# Patient Record
Sex: Female | Born: 1945 | Race: White | Hispanic: No | Marital: Married | Smoking: Never smoker
Health system: Southern US, Community
[De-identification: ages and names within clinical notes are randomized; demographics above are authoritative.]

## PROBLEM LIST (undated history)

## (undated) DIAGNOSIS — F329 Major depressive disorder, single episode, unspecified: Secondary | ICD-10-CM

## (undated) DIAGNOSIS — G20 Parkinson's disease: Secondary | ICD-10-CM

## (undated) DIAGNOSIS — R51 Headache: Secondary | ICD-10-CM

## (undated) DIAGNOSIS — D649 Anemia, unspecified: Secondary | ICD-10-CM

## (undated) DIAGNOSIS — R32 Unspecified urinary incontinence: Secondary | ICD-10-CM

## (undated) DIAGNOSIS — R251 Tremor, unspecified: Secondary | ICD-10-CM

## (undated) DIAGNOSIS — R519 Headache, unspecified: Secondary | ICD-10-CM

## (undated) DIAGNOSIS — E7800 Pure hypercholesterolemia, unspecified: Secondary | ICD-10-CM

## (undated) DIAGNOSIS — F32A Depression, unspecified: Secondary | ICD-10-CM

## (undated) HISTORY — DX: Headache: R51

## (undated) HISTORY — DX: Headache, unspecified: R51.9

## (undated) HISTORY — DX: Unspecified urinary incontinence: R32

---
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
## 2009-08-23 ENCOUNTER — Encounter: Admission: RE | Admit: 2009-08-23 | Discharge: 2009-09-12 | Payer: Self-pay | Admitting: Sports Medicine

## 2015-08-07 LAB — HM MAMMOGRAPHY

## 2016-05-15 NOTE — Progress Notes (Signed)
Nicole Mills was seen today in the movement disorders clinic for neurologic consultation at the request of [place], Cleta Alberts, NP.  The consultation is for the evaluation of Parkinson's disease.  This patient is accompanied in the office by her daughters who supplement the history.  I have reviewed an extensive number of records from the patient's prior neurologist, Dr. Mervyn Skeeters.  The patient was diagnosed with Parkinson's disease at the age of 71 years old.  Her symptoms started on the right side of the body with slowness and stiffness.  Records indicate that she did well with her Parkinson's disease with the treatment of levodopa until fairly recently.  Freezing of gait started in early 2017.  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  She began to have off periods, particularly in the middle of the night and several times was tried on the extended release version of levodopa at bedtime, but reported that it caused headache and it was ultimately discontinued.  In December 30, 2015 entacapone was added to her levodopa regimen.  The patients daughter called to Dr. Sharion Dove office on December 8 stating that this medication caused diarrhea.  Dr. Mervyn Skeeters was not completely convinced that this was from the entacapone given that it took several weeks, and she asked her to discontinue it temporarily.  She went back on the medication and didn't have the diarrheaThe patient's biggest issues have been anxiety, which have been unrelated to off episodes.  Initially, Dr. Mervyn Skeeters tried to treat these episodes, but ultimately she was referred to psychiatrist and had her first psychiatry visit on 02/03/2016.  The patient reports that she was placed on Vistaril and Lexapro and her clonazepam was increased so that she could take it up to 5 times a day.  She ultimately ended up having hallucinations and significant worsening of her anxiety and so Vistaril was discontinued.  They called to Dr.  Sharion Dove office on 03/19/2016 and quetiapine was started.  Family states that they didn't start it because of black box warning.  They thought that it caused dementia. Memory change continued and Exelon was prescribed on 05/08/2016.  While they have the prescription, the family admits that she has not started it.  Daughter thinks that memory is not an issue but that medications were a contributing factor.  Pt is currently taking carbidopa/levodopa 25/100, 1.5 tablets with entacapone 5 times per day starting at 5:45am, 8:15am, 10:55am, 1:45pm, 4:40pm.  At 7:30 she will take the 25/100 CR and again at 12:15am.  She takes mirtazapine 15 mg and klonopin 0.5 mg, 1/2 at bedtime.  She takes klonopin, 1/2 tablet tid for panic attacks, in addition to the bedtime dose.  She went to another psychiatrist and was changed to ativan, but her daughter usually does not give her that.    Specific Symptoms:  Tremor: Yes.  , but controlled with med Family hx of similar:  Yes.  , maternal uncle Voice: "not bad" - never been to [facility] Sleep: sleeping well  Vivid Dreams:  No.  Acting out dreams:  No. Wet Pillows: No. Postural symptoms:  Yes.    Falls?  No. Bradykinesia symptoms: shuffling gait, slow movements and difficulty getting out of a chair; doesn't use cane or walker Loss of smell:  No. Loss of taste:  No. Urinary Incontinence:  No. Difficulty Swallowing:  No. Handwriting, micrographia: Yes.   Trouble with ADL's:  Yes.   (husband helps with shoes only)  Trouble  buttoning clothing: No. (but uses zippers) Depression:  Yes.   Memory changes:  Yes.   (Lives with husband and used to have adult son living with them who had schizophrenic son living with them but he went off his meds and had to move out and pt bought him a Games developer but pts anxiety didn't get better after he moved out) Hallucinations:  Yes.   (mostly during the night but some during the day  visual distortions: No. N/V:  No. Lightheaded:  Yes.    some in the AM  Syncope: No. Diplopia:  No. Dyskinesia:  Yes.     PREVIOUS MEDICATIONS:  levodopa, carbidopa/levodopa 50/200 CR (patient states that this causes headache), seroquel (given but not taken by Dr. Mervyn Skeeters); exelon (given by Dr. Mervyn Skeeters but not taken)  ALLERGIES:   Allergies  Allergen Reactions  . Penicillins Hives    CURRENT MEDICATIONS:  Outpatient Encounter Prescriptions as of 05/19/2016  Medication Sig  . atorvastatin (LIPITOR) 10 MG tablet Take 1 tablet by mouth daily.  . Carbidopa-Levodopa ER (SINEMET CR) 25-100 MG tablet controlled release Take 1 tablet by mouth 2 (two) times daily.  . clonazePAM (KLONOPIN) 1 MG tablet Take 0.5-1 mg by mouth daily.  . Cyanocobalamin (B-12) 5000 MCG CAPS Take 1 capsule by mouth daily.  . entacapone (COMTAN) 200 MG tablet Take 200 mg by mouth 5 (five) times daily.  Marland Kitchen escitalopram (LEXAPRO) 10 MG tablet Take 10 mg by mouth daily.  . mirtazapine (REMERON) 15 MG tablet Take 15 mg by mouth at bedtime.  . Multiple Vitamin (MULTIVITAMIN) capsule Take 1 capsule by mouth daily.  . Probiotic Product (PROBIOTIC PO) Take by mouth.  . vitamin C (ASCORBIC ACID) 500 MG tablet Take 500 mg by mouth daily.   No facility-administered encounter medications on file as of 05/19/2016.     PAST MEDICAL HISTORY:  No past medical history on file.  PAST SURGICAL HISTORY:  No past surgical history on file.  SOCIAL HISTORY:   Social History   Social History  . Marital status: Unknown    Spouse name: N/A  . Number of children: N/A  . Years of education: N/A   Occupational History  . Not on file.   Social History Main Topics  . Smoking status: Never Smoker  . Smokeless tobacco: Never Used  . Alcohol use Not on file  . Drug use: Unknown  . Sexual activity: Not on file   Other Topics Concern  . Not on file   Social History Narrative  . No narrative on file    FAMILY HISTORY:   No family status information on file.    ROS:  A complete 10  system review of systems was obtained and was unremarkable apart from what is mentioned above.  PHYSICAL EXAMINATION:    VITALS:   Vitals:   05/19/16 0941  BP: (!) 96/54  Pulse: (!) 59  SpO2: 95%  Weight: 136 lb 12.8 oz (62.1 kg)  Height: 5' 3" (1.6 m)    GEN:  The patient appears stated age and is in NAD. HEENT:  Normocephalic, atraumatic.  The mucous membranes are moist. The superficial temporal arteries are without ropiness or tenderness. CV:  RRR Lungs:  CTAB Neck/HEME:  There are no carotid bruits bilaterally.  Neurological examination:  Orientation:  Montreal Cognitive Assessment  05/19/2016 05/19/2016  Visuospatial/ Executive (0/5) 1 1  Naming (0/3) 2 2  Attention: Read list of digits (0/2) 1 0  Attention: Read list of letters (0/1)  0 0  Attention: Serial 7 subtraction starting at 100 (0/3) 1 1  Language: Repeat phrase (0/2) 0 0  Language : Fluency (0/1) 0 0  Abstraction (0/2) 0 0  Delayed Recall (0/5) 2 -  Orientation (0/6) 3 -  Total 10 -  Adjusted Score (based on education) 10 -   Cranial nerves: There is good facial symmetry.   There is mild facial hypomimia.  Pupils are equal round and reactive to light bilaterally. Fundoscopic exam reveals clear margins bilaterally. Extraocular muscles are intact. The visual fields are full to confrontational testing. The speech is fluent and clear.   She is hypophonic.  Soft palate rises symmetrically and there is no tongue deviation. Hearing is intact to conversational tone. Sensation: Sensation is intact to light and pinprick throughout (facial, trunk, extremities). Vibration is intact at the bilateral big toe. There is no extinction with double simultaneous stimulation. There is no sensory dermatomal level identified. Motor: Strength is 5/5 in the bilateral upper and lower extremities.   Shoulder shrug is equal and symmetric.  There is no pronator drift. Deep tendon reflexes: Deep tendon reflexes are 2+-3/4 at the bilateral  biceps, triceps, brachioradialis, patella and achilles. Plantar responses are upgoing on the right and neutral on the left.  Movement examination: Tone: There is normal tone in the bilateral upper extremities.  The tone in the lower extremities is normal..  Abnormal movements: There is mild to moderate dyskinesia, right more than L.  She has some dystonic posturing of the hands.  Coordination:  There is no decremation with RAM's, with any form of RAMS, including alternating supination and pronation of the forearm, hand opening and closing, finger taps, heel taps and toe taps. Gait and Station: The patient has difficulty arising out of a deep-seated chair without the use of the hands.  She makes several attempts, but is unable to do this without pushing off the chair.  Her stride length was actually quite good and she walks quickly and purposefully down the Certain.     ASSESSMENT/PLAN:  1.  Idiopathic Parkinson's disease.  The patient was diagnosed in approximately 2011.  Her disease has been complicated by freezing of gait, hallucinations and memory change.  -We discussed the diagnosis as well as pathophysiology of the disease.  We discussed treatment options as well as prognostic indicators.  Patient education was provided.  -We discussed that it used to be thought that levodopa would increase risk of melanoma but now it is believed that Parkinsons itself likely increases risk of melanoma. she is to get regular skin checks.  -She looked very adequately treated today and it was almost time for her to re-dose.  I am not sure she even needed to re-dose.  Her daughter was actually going to try to spread out dosing more.  Pt is currently taking carbidopa/levodopa 25/100, 1.5 tablets with entacapone 5 times per day starting at 5:45am, 8:15am, 10:55am, 1:45pm, 4:40pm.  At 7:30 p.m. she will take the 25/100 CR and again at 12:15am.  Dr. Mervyn Skeeters tried the carbidopa/levodopa 50/200 CR several times and the patient  reported it caused headache.  I did not change her medications today.  -Talked about duopa.  We talked about several things in testing including the levodopa inhaler and the pump/patch system.  -I will refer the patient to the Parkinson's program at the [facility], for PT/OT and ST.  We talked about the importance of safe, cardiovascular exercise in Parkinson's disease.  -We discussed community resources in  the area including patient support groups and community exercise programs for PD and pt education was provided to the patient.  She met with our social worker today to discuss these resources as well.  -congratulated her on [facility] and all of the exercise that she is doing.  She goes there 3 times a week.  2.  Memory Loss/parkinson dementia  -recommend neuropsych testing  -she is still driving, but I do not think that she likely should be driving, just based on [facility] testing.  We will see what their recommendations are from the neuropsychologist.    -Her daughter is monitoring her medications and has set up an alarmed pillbox.  3.  Anxiety  -This has been a very significant issue according to records and I reiterated to the patient and her family that she needs to continue to seek care with a psychiatrist.  Dr. Mervyn Skeeters has are the demonstrated that her anxiety is not an off issue.  I really would like to see her off of benzodiazepines during the day, primarily because of cognitive changes and risk of falls.  She was given a list of psychiatrists, at her family's request.  4.  -Greater than 50% of the 80 minute visit was spent in counseling answering questions and talking about what to expect now as well as in the future.    We talked about safety in the home.  This did not include the 45 minutes of non-face-to-face time that I spent in record review prior to the patient's visit.  I will plan on seeing her back in the next 3-5 months, sooner should new neurologic issues arise.  Cc:   [place], [name], NP

## 2016-05-19 ENCOUNTER — Encounter: Payer: Self-pay | Admitting: Neurology

## 2016-05-19 ENCOUNTER — Ambulatory Visit (INDEPENDENT_AMBULATORY_CARE_PROVIDER_SITE_OTHER): Payer: Medicare Other | Admitting: Neurology

## 2016-05-19 VITALS — BP 96/54 | HR 59 | Ht 63.0 in | Wt 136.8 lb

## 2016-05-19 DIAGNOSIS — G20 Parkinson's disease: Secondary | ICD-10-CM

## 2016-05-19 DIAGNOSIS — F0280 Dementia in other diseases classified elsewhere without behavioral disturbance: Secondary | ICD-10-CM

## 2016-05-19 DIAGNOSIS — F411 Generalized anxiety disorder: Secondary | ICD-10-CM | POA: Diagnosis not present

## 2016-05-19 DIAGNOSIS — G20A1 Parkinson's disease without dyskinesia, without mention of fluctuations: Secondary | ICD-10-CM

## 2016-05-19 NOTE — Progress Notes (Signed)
Psychosocial/Social Work Assessment Movement Disorders Clinic, [facility]  Patient: Nicole Mills MRN: [id number] Date of Assessment: 05/19/2016  PHYSICAL/MEDICAL SITUATION  Current Movement Disorder Diagnosis?: Parkinson's disease   DEMOGRAPHIC INFORMATION  Race/Ethnicity: Caucasian  Age: 71 y.o.  Relationship Status    Married      Living Situation     With spouse/partner        COGNITION  AND MENTAL  STATUS Alert & Oriented x 4   Comments: Reports of memory change     AMBULATION   No balance issues     AMBULATION ASSISTANCE   Answer the following as Yes (Y) or No (N)   Walks independently: Y  Uses a cane or trekking poles: N  Uses a walker: N  Uses a wheelchair: N    Activities of Daily Living (ADLs)  Independent in activities of daily living?  Yes  No  Some   How many stories is the home?  1 story home On what story are primary living areas?  N/A  Feel safe in the home?     Driving  Currently driving? Yes     Transportation:  Does patient have reliable transportation to medical appointments?  Yes  No  Does patient need transportation resources?  Yes  No  Comments:       EXERCISE AND ACTIVITY ENGAGEMENT  Information about Current Exercise    Types of exercise: [facility]   Frequency: 3 x a week      Past Rehabilitation or Therapy     Physical Therapy No Does pt need referral for? Referral made  Occupational therapy: No Does pt need referral for?Referral made  Speech therapy: No Does pt need referral for? Referral made     MOOD, COPING, AND SUPPORT        Mood and Affect      Anxious           Current or History of Medications for Mood (Yes (Y) or No (N)? Yes  Use of Mental Health Services: No- resources provided     Attends support group?  (Yes (Y) or No (N)) No   Interested in information on local support groups? Yes- information provided  Family live nearby? Yes  Use in-home care? No  Support System    Good  Poor  Other/Comments:  Supportive daughters         Clinical Social Work Assessment and Plan   CSW received request from Dr. Carles Collet to meet with pt and pt daughters during today's office visit. CSW met with pt and pt daughters in exam room. CSW introduced self and informedof social work services including connection to [employer], short-term coping/problem-solving counseling, and involvement in [employer] support group.   CSW provided supportive listening as pt shared that today is her first visit with Dr. Carles Collet, but pt was diagnosed with Parkinson's disease eight years ago. Pt lives with her husband in a one story home in [place]. Pt is independent with ADL's and ambulates independently.  Pt two daughter present during today's visit are supportive and actively involved in pt care.   Pt reports that she has been involved at [facility] for seven years. Pt reports that she goes to personal training three times a week. CSW clarified pt daughters questions about  center neurorehabilitation PT, OT, and ST as pt daughters report that Dr. Carles Collet is making a referral to neurorehab. Pt feels PT and OT will be beneficial, but reports that she  is unsure that she will need ST. CSW provided information on other community PD exercise programs and provided positive reinforcement for pt exercise with [facility] and her commitment to exercise.   Pt daughter reports that biggest challenge at this time is pt anxiety. CSW discussed mental health resources and provided contact information for mental health providers including psychiatrists and counselors. CSW discussed benefits of Cognitive Behavioral Therapy (CBT). CSW provided education surrounding finding the right therapist and encouraged pt to continue to seek therapist to find the right fit for her as the client-therapist relationship is important. CSW also discussed benefits of attending support group and pt states that she is willing to go to support group.  CSW provided information about [employer] Support Group and encouraged pt and pt family to attend.   CSW answered questions to the best of my knowledge throughout visit and provided community resources and mental health resources. CSW provided CSW contact information and encouraged pt and pt family to contact CSW if social work needs arise.    Product/process development scientist Provided: List of PD community resources/exercise programs, Mental Health Providers List, Gaffer for [employer].  Social Work Treatment Plan:  1. CSW provided psychosocial support and counseling surrounding pt diagnosis and current challenges with anxiety. 2. CSW provided mental health resources and pt is recommended to seek psychiatric care for anxiety. 3. CSW to remain available to provide psychosocial support as necessary. 4. [name] and her family have been provided CSW contact information and agree to contact me if I can be of further assistance with any other social work services.     Alison Murray, MSW, LCSW Clinical Social Worker Movement [facility] [phone]

## 2016-05-19 NOTE — Patient Instructions (Signed)
1. You have been referred to Neuro Rehab. They will call you directly to schedule an appointment.  Please call [phone] if you do not hear from them.   2. Let us know about Neurocognitive evaluation and we will set that up.

## 2016-06-25 ENCOUNTER — Telehealth: Payer: Self-pay | Admitting: Neurology

## 2016-06-25 NOTE — Telephone Encounter (Signed)
Left message on machine for patient to call back.  Daughter not on DPR - Husband's name on DPR but it was not signed. I can only talk to the patient. Awaiting call back.

## 2016-06-25 NOTE — Telephone Encounter (Signed)
Caller: PT's daughter Claris Gower  Urgent? Yes  Reason for the call: She said her mother is declining rapidly and wanted to know if she should bring her in to see Dr Tat

## 2016-10-19 NOTE — Progress Notes (Signed)
Nicole Mills was seen today in the movement disorders clinic for neurologic consultation at the request of Nicole Mills, Nicole M, NP.  The consultation is for the evaluation of Parkinson's disease.  This patient is accompanied in the office by her daughters who supplement the history.  I have reviewed an extensive number of records from the patient's prior neurologist, Dr. Seward Mills.  The patient was diagnosed with Parkinson's disease at the age of 71 years old.  Her symptoms started on the right side of the body with slowness and stiffness.  Records indicate that she did well with her Parkinson's disease with the treatment of levodopa until fairly recently.  Freezing of gait started in early 2017.  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  She began to have off periods, particularly in the middle of the night and several times was tried on the extended release version of levodopa at bedtime, but reported that it caused headache and it was ultimately discontinued.  In December 30, 2015 entacapone was added to her levodopa regimen.  The patients daughter called to Dr. [name] office on December 8 stating that this medication caused diarrhea.  Dr. Seward Mills was not completely convinced that this was from the entacapone given that it took several weeks, and she asked her to discontinue it temporarily.  She went back on the medication and didn't have the diarrheaThe patient's biggest issues have been anxiety, which have been unrelated to off episodes.  Initially, Dr. Seward Mills tried to treat these episodes, but ultimately she was referred to psychiatrist and had her first psychiatry visit on 02/03/2016.  The patient reports that she was placed on Vistaril and Lexapro and her clonazepam was increased so that she could take it up to 5 times a day.  She ultimately ended up having hallucinations and significant worsening of her anxiety and so Vistaril was discontinued.  They called to Dr.  [name] office on 03/19/2016 and quetiapine was started.  Family states that they didn't start it because of black box warning.  They thought that it caused dementia. Memory change continued and Exelon was prescribed on 05/08/2016.  While they have the prescription, the family admits that she has not started it.  Daughter thinks that memory is not an issue but that medications were a contributing factor.  Pt is currently taking carbidopa/levodopa 25/100, 1.5 tablets with entacapone 5 times per day starting at 5:45am, 8:15am, 10:55am, 1:45pm, 4:40pm.  At 7:30 she will take the 25/100 CR and again at 12:15am.  She takes mirtazapine 15 mg and klonopin 0.5 mg, 1/2 at bedtime.  She takes klonopin, 1/2 tablet tid for panic attacks, in addition to the bedtime dose.  She went to another psychiatrist and was changed to ativan, but her daughter usually does not give her that.    10/20/16 update:  Patient is seen today in follow-up for her Parkinson's disease.  She is accompanied by her daughters who supplement the history.  I have reviewed records since our last visit.  She has since been back to Dr. Seward Mills.  She last saw her on 09/17/2016.  She made no changes in her Parkinson's medicine.  Pt is currently taking carbidopa/levodopa 25/100, 1.5 tablets with entacapone 6 times per day starting at 5:45am and continuing every 2 hours (was 5 times per day).  At 7:30 p.Mills. she will take the 25/100 CR and again at 12:15am.  The biggest issue that she was having and was discussing with  Dr. Seward Mills was anxiety.  This has been a long-standing issue.  Dr. Seward Mills had her continue her mirtazapine, 30 mg and clonazepam 0.5 mg, 1-1/2 tablets daily, but increase her Zoloft from 75 mg daily to 125 mg daily.  She was told to discontinue her Lexapro. Her family states that they went to her PA and she was told to d/c the zololft and increase the lexapro, 20 mg.  Her panic attacks are better.   Her family called Dr. [name] office and  asked for a prescription for Exelon and she was started on that at the end of July. Her daughter states that they haven't gone on that yet as her daughter "didn't know what to do."   I did refer her to the neuro rehabilitation center last visit for therapy.  They called her, but never received a call back to schedule that appointment.  I also recommended neurocognitive testing last visit, but they did not follow through with the neuropsychologist.  Daughter states that has lost weight over the year and patient thinks that due to panic attacks.  Had episodes of paranoia since last visit, running to neighbor and calling police.  Pt c/o head pain.  It is in a crown like pattern around the head but it is sharp and shooting.  She thinks it happens when she takes the klonopin but her daughter states that it has been going on for years and that is why she was originally prescribed the klonopin and that is why she wears a visor.  Her daughter states that she actually was hiding klonopin from her (daughter does her meds).    PREVIOUS MEDICATIONS:  levodopa, carbidopa/levodopa 50/200 CR (patient states that this causes headache), seroquel (given but not taken by Dr. Seward Mills); exelon (given by Dr. Seward Mills but not taken)  ALLERGIES:   Allergies  Allergen Reactions  . Penicillins Hives    CURRENT MEDICATIONS:  Outpatient Encounter Prescriptions as of 10/20/2016  Medication Sig  . carbidopa-levodopa (SINEMET IR) 25-100 MG tablet Take 1.5 tablets by mouth 6 (six) times daily.   . Carbidopa-Levodopa ER (SINEMET CR) 25-100 MG tablet controlled release Take 2 tablets by mouth at bedtime.   . clonazePAM (KLONOPIN) 1 MG tablet Take 0.5-1 mg by mouth daily. 0.5 mg 3-4 x daily and 0.5 at bedtime  . Cyanocobalamin (B-12) 5000 MCG CAPS Take 1 capsule by mouth daily.  . entacapone (COMTAN) 200 MG tablet Take 200 mg by mouth 6 (six) times daily.   Marland Kitchen escitalopram (LEXAPRO) 20 MG tablet Take 20 mg by mouth daily.  Marland Kitchen  levothyroxine (SYNTHROID, LEVOTHROID) 100 MCG tablet Take 100 mcg by mouth daily.  . mirtazapine (REMERON) 15 MG tablet Take 30 mg by mouth at bedtime.   . Multiple Vitamin (MULTIVITAMIN) capsule Take 1 capsule by mouth daily.  . Probiotic Product (PROBIOTIC PO) Take by mouth.  . vitamin C (ASCORBIC ACID) 500 MG tablet Take 500 mg by mouth daily.  . [DISCONTINUED] atorvastatin (LIPITOR) 10 MG tablet Take 1 tablet by mouth daily.  . [DISCONTINUED] escitalopram (LEXAPRO) 10 MG tablet Take 10 mg by mouth daily.   No facility-administered encounter medications on file as of 10/20/2016.     PAST MEDICAL HISTORY:  No past medical history on file.  PAST SURGICAL HISTORY:  No past surgical history on file.  SOCIAL HISTORY:   Social History   Social History  . Marital status: Unknown    Spouse name: N/A  . Number of children: N/A  . Years  of education: N/A   Occupational History  . Not on file.   Social History Main Topics  . Smoking status: Never Smoker  . Smokeless tobacco: Never Used  . Alcohol use Not on file  . Drug use: Unknown  . Sexual activity: Not on file   Other Topics Concern  . Not on file   Social History Narrative  . No narrative on file    FAMILY HISTORY:   No family status information on file.    ROS:  A complete 10 system review of systems was obtained and was unremarkable apart from what is mentioned above.  PHYSICAL EXAMINATION:    VITALS:   Vitals:   10/20/16 1104  BP: 110/68  Pulse: 84  SpO2: 98%  Weight: 117 lb (53.1 kg)  Height: 5\' 4"  (1.626 Mills)    GEN:  The patient appears stated age and is in NAD. HEENT:  Normocephalic, atraumatic.  The mucous membranes are moist. The superficial temporal arteries are without ropiness or tenderness. CV:  RRR Lungs:  CTAB Neck/HEME:  There are no carotid bruits bilaterally.  Neurological examination:  Orientation:  Montreal Cognitive Assessment  05/19/2016 05/19/2016  Visuospatial/ Executive (0/5) 1 1    Naming (0/3) 2 2  Attention: Read list of digits (0/2) 1 0  Attention: Read list of letters (0/1) 0 0  Attention: Serial 7 subtraction starting at 100 (0/3) 1 1  Language: Repeat phrase (0/2) 0 0  Language : Fluency (0/1) 0 0  Abstraction (0/2) 0 0  Delayed Recall (0/5) 2 -  Orientation (0/6) 3 -  Total 10 -  Adjusted Score (based on education) 10 -   Cranial nerves: There is good facial symmetry.   There is mild facial hypomimia.  Pupils are equal round and reactive to light bilaterally. Fundoscopic exam reveals clear margins bilaterally. Extraocular muscles are intact. The visual fields are full to confrontational testing. The speech is fluent and clear.   She is hypophonic.  Soft palate rises symmetrically and there is no tongue deviation. Hearing is intact to conversational tone. Sensation: Sensation is intact to light touch throughout. Motor: Strength is 5/5 in the bilateral upper and lower extremities.   Shoulder shrug is equal and symmetric.  There is no pronator drift.  Movement examination: Tone: There is normal tone in the bilateral upper extremities.  The tone in the lower extremities is normal..  Abnormal movements: There is mild dyskinesia, right more than left.  She has mild dystonic posturing of the hands. Coordination:  There is no decremation with RAM's, with any form of RAMS, including alternating supination and pronation of the forearm, hand opening and closing, finger taps, heel taps and toe taps. Gait and Station: The patient has difficulty arising out of a deep-seated chair without the use of the hands.  She makes several attempts, but is unable to do this without pushing off the chair.  Her stride length was actually quite good and she walks quickly and purposefully down the [place] but is not steady.  ASSESSMENT/PLAN:  1.  Idiopathic Parkinson's disease.  The patient was diagnosed in approximately 2011.  Her disease has been complicated by freezing of gait,  hallucinations and memory change.  -We discussed the diagnosis as well as pathophysiology of the disease.  We discussed treatment options as well as prognostic indicators.  Patient education was provided.  -We discussed that it used to be thought that levodopa would increase risk of melanoma but now it is believed that Parkinsons  itself likely increases risk of melanoma. she is to get regular skin checks.  -Her daughter has been concerned that she has wanted to increase her levodopa and actually has gone from 5 to 6 times per day since last visit, despite the fact that she has looked adequately treated when she has come in, even though it has been time for her to re-dose.  Her daughter is disturbing medication, but thinks that the patient had some and takes extra.  We talked about just trying to be vigilant in dosing her medication.   Pt is currently taking carbidopa/levodopa 25/100, 1.5 tablets with entacapone 6 times per day starting at 5:45am and continuing every 2 hours.  At 7:30 p.Mills. she will take the 25/100 CR and again at 12:15am.  Dr. Seward Mills tried the carbidopa/levodopa 50/200 CR several times and the patient reported it caused headache (but has a long history of chronic headache).  I did not change her medications today.  -talked to patient today about fact that she doesn't need 2 different movement d/o specialists.  She has seen Dr. Seward Mills for a long time and Dr. Seward Mills is excellent.  I thought that she was transitioning care because I was closer, but she ended up going back to Dr. Seward Mills.  I would recommend she continue care there, rather than maintaining both of [place].    -She will have physical therapy in the home.  She does not drive  2.  Memory Loss/parkinson dementia  -We talked about neurocognitive testing last visit and they did not follow through with the neuropsychologist.  I do not recommend she drives.  -Her daughter is monitoring her medications and has set up an alarmed  pillbox.  3.  Anxiety  -This has been a very significant issue according to records and I reiterated to the patient and her family that she needs to continue to seek care with a psychiatrist.  Dr. Seward Mills has are the demonstrated that her anxiety is not an off issue.  I really would like to see her off of benzodiazepines during the day, primarily because of cognitive changes and risk of falls.  Her daughter likewise would like her off of daytime clonazepam and her daughter asked me several times about this today.  The patient has seen multiple different psychiatrists, without success.  I still think she needs to see a psychiatrist.  She is going to see [facility] soon.    4. Headache  -She has a very long history of this.  -will try low dose gabapentin, 100 mg at night for 2 weeks and then if she does not have side effects and still has some headache, she can increase to twice a day dosing.  5.  She will follow-up with Dr. Seward Mills as previously scheduled.  Much greater than 50% of this visit was spent in counseling and coordinating care.  Total face to face time:  25 min   Cc:  Nicole Mills, Nicole M, NP

## 2016-10-20 ENCOUNTER — Encounter: Payer: Self-pay | Admitting: Neurology

## 2016-10-20 ENCOUNTER — Ambulatory Visit (INDEPENDENT_AMBULATORY_CARE_PROVIDER_SITE_OTHER): Payer: Medicare Other | Admitting: Neurology

## 2016-10-20 VITALS — BP 110/68 | HR 84 | Ht 64.0 in | Wt 117.0 lb

## 2016-10-20 DIAGNOSIS — F0280 Dementia in other diseases classified elsewhere without behavioral disturbance: Secondary | ICD-10-CM

## 2016-10-20 DIAGNOSIS — F411 Generalized anxiety disorder: Secondary | ICD-10-CM | POA: Diagnosis not present

## 2016-10-20 DIAGNOSIS — G20 Parkinson's disease: Secondary | ICD-10-CM | POA: Diagnosis not present

## 2016-10-20 MED ORDER — GABAPENTIN 100 MG PO CAPS: 100.0000 mg | ORAL_CAPSULE | Freq: Two times a day (BID) | ORAL | 1 refills | Status: DC

## 2016-10-20 NOTE — Patient Instructions (Addendum)
1.  Start gabapentin - 100 mg at night for 2 weeks and if you still have the headache and no side effects you can increase to twice per day  2.  Follow up with Dr. [name] as previously scheduled  3.  We will send PT to the house.  Call me if you don't hear from them  4.  Good to see you!

## 2016-10-20 NOTE — Addendum Note (Signed)
Addended bySilvio Pate on: 10/20/2016 12:41 PM   Modules accepted: Orders

## 2016-10-27 ENCOUNTER — Telehealth: Payer: Self-pay | Admitting: Neurology

## 2016-10-27 NOTE — Telephone Encounter (Signed)
Home Health called ad said PT refused therapy last week and they are trying to schedule something for this week and just wanted to advise Dr Tat of that

## 2016-10-27 NOTE — Telephone Encounter (Signed)
FYI

## 2016-10-28 ENCOUNTER — Telehealth: Payer: Self-pay | Admitting: Neurology

## 2016-10-28 NOTE — Telephone Encounter (Signed)
Patient needs to talk to someone about having someone come to her house. She is wanting to know why Dr Tat has not came to her house for therapy I explained to her that Dr tat does not do that we do a referral to somewhere else but she wants to talk to someone today

## 2016-10-28 NOTE — Telephone Encounter (Signed)
Left a message for patient's daughter Nicholes CalamityCharlotte Valente [phone] to call me back. Referral was sent for PT with a note to call daughter, seeing if she has heard back from them. Patient has dementia.

## 2016-10-29 NOTE — Telephone Encounter (Signed)
Received VM from daughter stating that patient doing well on Gabapentin BID but feels like she needs more.  Left message on machine for patient's daughter to call back. To discuss this and home health PT.

## 2016-10-29 NOTE — Telephone Encounter (Signed)
Harriette OharaGave Lee verbal orders for PT 2x week for 3 weeks, then 1x week for 2 weeks.   They did see patient last week. Still awaiting call back from daughter.

## 2016-11-03 ENCOUNTER — Ambulatory Visit (HOSPITAL_COMMUNITY): Payer: PRIVATE HEALTH INSURANCE | Admitting: Psychiatry

## 2017-07-11 ENCOUNTER — Encounter (HOSPITAL_BASED_OUTPATIENT_CLINIC_OR_DEPARTMENT_OTHER): Payer: Self-pay | Admitting: Emergency Medicine

## 2017-07-11 ENCOUNTER — Emergency Department (HOSPITAL_BASED_OUTPATIENT_CLINIC_OR_DEPARTMENT_OTHER): Payer: Medicare Other

## 2017-07-11 ENCOUNTER — Emergency Department (HOSPITAL_BASED_OUTPATIENT_CLINIC_OR_DEPARTMENT_OTHER)
Admission: EM | Admit: 2017-07-11 | Discharge: 2017-07-11 | Disposition: A | Discharge status: Home / Self Care | Payer: Medicare Other | Attending: Emergency Medicine | Admitting: Emergency Medicine

## 2017-07-11 ENCOUNTER — Other Ambulatory Visit: Payer: Self-pay

## 2017-07-11 DIAGNOSIS — G20 Parkinson's disease: Secondary | ICD-10-CM | POA: Diagnosis not present

## 2017-07-11 DIAGNOSIS — Y929 Unspecified place or not applicable: Secondary | ICD-10-CM | POA: Insufficient documentation

## 2017-07-11 DIAGNOSIS — W01190A Fall on same level from slipping, tripping and stumbling with subsequent striking against furniture, initial encounter: Secondary | ICD-10-CM | POA: Insufficient documentation

## 2017-07-11 DIAGNOSIS — Z79899 Other long term (current) drug therapy: Secondary | ICD-10-CM | POA: Insufficient documentation

## 2017-07-11 DIAGNOSIS — Y939 Activity, unspecified: Secondary | ICD-10-CM | POA: Diagnosis not present

## 2017-07-11 DIAGNOSIS — Y999 Unspecified external cause status: Secondary | ICD-10-CM | POA: Diagnosis not present

## 2017-07-11 DIAGNOSIS — W19XXXA Unspecified fall, initial encounter: Secondary | ICD-10-CM

## 2017-07-11 DIAGNOSIS — S0003XA Contusion of scalp, initial encounter: Secondary | ICD-10-CM | POA: Insufficient documentation

## 2017-07-11 DIAGNOSIS — S098XXA Other specified injuries of head, initial encounter: Secondary | ICD-10-CM | POA: Diagnosis present

## 2017-07-11 HISTORY — DX: Depression, unspecified: F32.A

## 2017-07-11 HISTORY — DX: Major depressive disorder, single episode, unspecified: F32.9

## 2017-07-11 HISTORY — DX: Parkinson's disease: G20

## 2017-07-11 HISTORY — DX: Pure hypercholesterolemia, unspecified: E78.00

## 2017-07-11 HISTORY — DX: Tremor, unspecified: R25.1

## 2017-07-11 HISTORY — DX: Anemia, unspecified: D64.9

## 2017-07-11 NOTE — ED Triage Notes (Signed)
Patient states that she fell yesterday onto a wooded table. The patient denies any LOC - has large amount of bruising to her neck and to her throat region. Denies any SOS or trouble swallowing

## 2017-07-11 NOTE — ED Provider Notes (Signed)
[facility] EMERGENCY DEPARTMENT Provider Note   CSN: [id number] Arrival date & time: 07/11/17  1409     History   Chief Complaint Chief Complaint  Patient presents with  . Fall    HPI Nicole Mills is a 72 y.o. female.  HPI Patient presents after a fall yesterday.  History of Parkinson's or falls no unusual for her.  She lost her balance and fell, striking the back side of her head on a table.  No loss conscious.  Now today she has ecchymosis along the right side of her neck.  Some pain in the back of her head but no confusion.  No neck pain.  She is not on anticoagulation. Past Medical History:  Diagnosis Date  . Anemia   . Depression   . High cholesterol   . Parkinson's disease (HCC)   . Tremor     There are no active problems to display for this patient.   History reviewed. No pertinent surgical history.   OB History   None      Home Medications    Prior to Admission medications   Medication Sig Start Date End Date Taking? Authorizing Provider  carbidopa-levodopa (SINEMET IR) 25-100 MG tablet Take 1.5 tablets by mouth 6 (six) times daily.     [provider]  Carbidopa-Levodopa ER (SINEMET CR) 25-100 MG tablet controlled release Take 2 tablets by mouth at bedtime.     [provider]  clonazePAM (KLONOPIN) 1 MG tablet Take 0.5-1 mg by mouth daily. 0.5 mg 3-4 x daily and 0.5 at bedtime 02/03/16   [provider]  Cyanocobalamin (B-12) 5000 MCG CAPS Take 1 capsule by mouth daily.    [provider]  entacapone (COMTAN) 200 MG tablet Take 200 mg by mouth 6 (six) times daily.     [provider]  escitalopram (LEXAPRO) 20 MG tablet Take 20 mg by mouth daily. 09/29/16   [provider]  gabapentin (NEURONTIN) 100 MG capsule Take 1 capsule (100 mg total) by mouth 2 (two) times daily. 10/20/16   Tat, Octaviano Batty, DO  levothyroxine (SYNTHROID, LEVOTHROID) 100 MCG tablet Take 100 mcg by mouth daily. 09/09/16    [provider]  mirtazapine (REMERON) 15 MG tablet Take 30 mg by mouth at bedtime.     [provider]  Multiple Vitamin (MULTIVITAMIN) capsule Take 1 capsule by mouth daily.    [provider]  Probiotic Product (PROBIOTIC PO) Take by mouth.    [provider]  vitamin C (ASCORBIC ACID) 500 MG tablet Take 500 mg by mouth daily.    [provider]    Family History History reviewed. No pertinent family history.  Social History Social History   Tobacco Use  . Smoking status: Never Smoker  . Smokeless tobacco: Never Used  Substance Use Topics  . Alcohol use: Not on file  . Drug use: Not on file     Allergies   Penicillins   Review of Systems Review of Systems  Constitutional: Negative for appetite change and fever.  HENT: Negative for congestion.   Respiratory: Negative for shortness of breath.   Cardiovascular: Negative for chest pain.  Gastrointestinal: Negative for abdominal pain.  Genitourinary: Negative for flank pain.  Musculoskeletal: Negative for arthralgias and neck pain.  Skin: Negative for rash.  Neurological: Positive for speech difficulty. Negative for headaches.  Hematological: Bruises/bleeds easily.     Physical Exam Updated Vital Signs BP 138/66 (BP Location: Right Arm)  Pulse 83   Temp 99 F (37.2 C) (Oral)   Resp 20   Ht  (1.626 m)   Wt 47.2 kg (104 lb)   SpO2 98%   BMI 17.85 kg/m   Physical Exam  Constitutional: She is oriented to person, place, and time. She appears well-developed.  HENT:  Right-sided occipital hematoma approximately 4 cm across.  There is ecchymosis from this extending inferiorly and posteriorly along with the right side of her neck.  No bony tenderness on the neck or skull clearly.  Neck: Neck supple.  Ecchymosis posteriorly laterally on the right side and somewhat anteriorly without underlying bony tenderness or swelling.  Cardiovascular: Normal rate.    Pulmonary/Chest: Effort normal.  Abdominal: Bowel sounds are normal.  Musculoskeletal: She exhibits no tenderness.  Neurological: She is alert and oriented to person, place, and time.  Parkinsonian movements.  Skin: Skin is warm. Capillary refill takes less than 2 seconds.     ED Treatments / Results  Labs (all labs ordered are listed, but only abnormal results are displayed) Labs Reviewed - No data to display  EKG None  Radiology Ct Head Wo Contrast  Result Date: 07/11/2017 CLINICAL DATA:  72 year old female with headache following fall and head injury yesterday. Initial encounter. Patient with Parkinson's and tremor EXAM: CT HEAD WITHOUT CONTRAST TECHNIQUE: Contiguous axial images were obtained from the base of the skull through the vertex without intravenous contrast. COMPARISON:  None. FINDINGS: Please note that there is moderate motion artifact due to patient's Parkinson's which significantly degrades images despite rescanning. Brain: No definite evidence of acute infarction, hemorrhage, hydrocephalus, extra-axial collection or mass lesion/mass effect. Atrophy and probable chronic small-vessel white matter ischemic changes noted. Vascular: No gross abnormality identified. Skull: No definite acute abnormality. Sinuses/Orbits: No definite acute abnormality. Other: Moderate posterior RIGHT scalp hematoma noted. IMPRESSION: 1. Motion degraded study despite rescanning limiting evaluation but no definite acute intracranial abnormality identified. 2. Moderate posterior RIGHT scalp hematoma. Electronically Signed   By: Harmon Pier M.D.   On: 07/11/2017 16:07    Procedures Procedures (including critical care time)  Medications Ordered in ED Medications - No data to display   Initial Impression / Assessment and Plan / ED Course  I have reviewed the triage vital signs and the nursing notes.  Pertinent labs & imaging results that were available during my care of the patient were reviewed  by me and considered in my medical decision making (see chart for details).     Patient with scalp hematoma with ecchymosis spreading inferiorly along neck.  Doubt any actual neck injury.  Head CT done due to the size of the hematoma on her skull.  Reassuring CT.  Discharge home per  Final Clinical Impressions(s) / ED Diagnoses   Final diagnoses:  Fall, initial encounter  Scalp hematoma, initial encounter    ED Discharge Orders    None       Benjiman Core, MD 07/11/17 1745

## 2017-07-11 NOTE — ED Notes (Signed)
Patient transported to CT 

## 2017-09-08 ENCOUNTER — Telehealth: Payer: Self-pay | Admitting: Surgical

## 2017-09-08 NOTE — Telephone Encounter (Signed)
We received a letter from patients home health nurse stating that patient fell on July 4th. Dr. Earlene PlaterWallace has looked at the note and requested that I call the patient to get her in today to be seen. I have tried both numbers listed and was not able to get anyone on the phone. No voicemail was set up for second number and first one just went blank when called. I will try to call them again. I will scan in the letter.

## 2017-11-03 ENCOUNTER — Ambulatory Visit: Payer: PRIVATE HEALTH INSURANCE | Admitting: Family Medicine

## 2019-03-30 ENCOUNTER — Other Ambulatory Visit: Payer: Self-pay | Admitting: Nurse Practitioner

## 2019-04-18 ENCOUNTER — Ambulatory Visit: Payer: Medicare Other | Attending: Internal Medicine

## 2019-04-18 DIAGNOSIS — Z20822 Contact with and (suspected) exposure to covid-19: Secondary | ICD-10-CM

## 2019-04-19 ENCOUNTER — Telehealth: Payer: Self-pay | Admitting: *Deleted

## 2019-04-19 ENCOUNTER — Encounter: Payer: Self-pay | Admitting: *Deleted

## 2019-04-19 LAB — NOVEL CORONAVIRUS, NAA: SARS-CoV-2, NAA: DETECTED — AB

## 2019-04-19 NOTE — Telephone Encounter (Addendum)
Sent patient the following mychart message, as patient reviewed positive covid test results this a.m. at 10:37 a.m.   This RN was unable to complete a result note.    You have viewed your COVID-19 result in MyChart. Your test was POSITIVE/"Detected", meaning that you were infected with the novel coronavirus and could give the germ to others. Please quarantine and remain in self isolation until at least 10 days since symptoms onset And 3 consecutive days fever free without the use of fever reducing medications(Tylenol and/or Ibuprofen) And improvement in respiratory symptoms. If you were exposed to someone who was positive for COVID-19, you will need to quarantine and self-isolate for 14 days from the date of exposure.Use over-the-counter medications for symptoms.If you develop respiratory issues/distress, seek medical care in the Emergency Department.  If you must leave home or if you have to be around others please wear a mask. Please limit contact with immediate family members in the home, practice social distancing, frequent handwashing and clean hard surfaces touched frequently with household cleaning products. Members of your household will also need to quarantine for 14 days from the date of your positive test.You may also be contacted by the health department for follow up. Please call [facility] at [phone] if you have any questions or concerns.

## 2019-04-20 ENCOUNTER — Telehealth (HOSPITAL_COMMUNITY): Payer: Self-pay | Admitting: Nurse Practitioner

## 2019-04-20 ENCOUNTER — Encounter: Payer: Self-pay | Admitting: Nurse Practitioner

## 2019-04-20 ENCOUNTER — Other Ambulatory Visit: Payer: Self-pay | Admitting: Nurse Practitioner

## 2019-04-20 ENCOUNTER — Telehealth: Payer: Self-pay | Admitting: Nurse Practitioner

## 2019-04-20 DIAGNOSIS — U071 COVID-19: Secondary | ICD-10-CM

## 2019-04-20 DIAGNOSIS — G20 Parkinson's disease: Secondary | ICD-10-CM

## 2019-04-20 NOTE — Progress Notes (Signed)
Your test for COVID-19 was positive ("detected"), meaning that you were infected with the novel coronavirus and could give the germ to others.    Please continue isolation at home, for at least 10 days since the start of your fever/cough/breathlessness and until you have had 24 hours without fever (without taking a fever reducer) and with any cough/breathlessness improving. Use over-the-counter medications for symptoms.  If you have had no symptoms, but were exposed to someone who was positive for COVID-19, you will need to quarantine and self-isolate for 14 days from the date of exposure.    Please continue good preventive care measures, including:  frequent hand-washing, avoid touching your face, cover coughs/sneezes, stay out of crowds and keep a 6 foot distance from others.  Clean hard surfaces touched frequently with disinfectant cleaning products.   Please check in with your primary care provider about your positive test result.  Go to the nearest urgent care or ED for assessment if you have severe breathlessness or severe weakness/fatigue (ex needing new help getting out of bed or to the bathroom).  Members of your household will also need to quarantine for 14 days from the date of your positive test. You may be contacted to discuss possible treatment options, and you may also be contacted by the health department for follow up. Please call [place] at [phone] if you have any questions or concerns.     

## 2019-04-20 NOTE — Telephone Encounter (Signed)
Called to discuss with Nicole Mills about Covid symptoms and the use of bamlanivimab, a monoclonal antibody infusion for those with mild to moderate Covid symptoms and at a high risk of hospitalization.     Pt is qualified for this infusion at the [facility] infusion center due to co-morbid conditions and/or a member of an at-risk group. Patient tested positive on 04/18/19. She is having worsening cough, congestion, and body aches today which started on 04/19/19. As requested I was able to speak with patient's daughter at [phone]. Daughter confirms treatment and appointment details as requested.   Patient scheduled for 04/21/19 at 1230pm.   Symptoms tier reviewed as well as criteria for ending isolation.  Symptoms reviewed that would warrant ED/Hospital evaluation. Preventative practices reviewed. Daughter verbalized understanding. Callback number to the infusion center given. Patient advised to go to Urgent care or ED with severe symptoms.  Nicole Mills, AGPCNP-BC Pager: [phone] Amion: Thea Alken

## 2019-04-20 NOTE — Telephone Encounter (Signed)
Called to Discuss with patient about Covid symptoms and the use of bamlanivimab, a monoclonal antibody infusion for those with mild to moderate Covid symptoms and at a high risk of hospitalization.     Pt is qualified for this infusion at the [facility] infusion center due to co-morbid conditions and/or a member of an at-risk group.     Unable to reach pt. Left message & sent mychart message.   Consuello Masse, DNP, AGNP-C [phone] (Infusion Center Hotline)

## 2019-04-20 NOTE — Progress Notes (Signed)
  I connected by phone with Nicole Mills on 04/20/2019 at 1:08 PM to discuss the potential use of an new treatment for mild to moderate COVID-19 viral infection in non-hospitalized patients.  This patient is a 74 y.o. female that meets the FDA criteria for Emergency Use Authorization of bamlanivimab or casirivimab\imdevimab.  Has a (+) direct SARS-CoV-2 viral test result  Has mild or moderate COVID-19   Is ? 76 years of age and weighs ? 40 kg  Is NOT hospitalized due to COVID-19  Is NOT requiring oxygen therapy or requiring an increase in baseline oxygen flow rate due to COVID-19  Is within 10 days of symptom onset  Has at least one of the high risk factor(s) for progression to severe COVID-19 and/or hospitalization as defined in EUA.  Specific high risk criteria : >/= 76 yo and Parkinsons.    I have spoken and communicated the following to the patient or parent/caregiver:  1. FDA has authorized the emergency use of bamlanivimab and casirivimab\imdevimab for the treatment of mild to moderate COVID-19 in adults and pediatric patients with positive results of direct SARS-CoV-2 viral testing who are 44 years of age and older weighing at least 40 kg, and who are at high risk for progressing to severe COVID-19 and/or hospitalization.  2. The significant known and potential risks and benefits of bamlanivimab and casirivimab\imdevimab, and the extent to which such potential risks and benefits are unknown.  3. Information on available alternative treatments and the risks and benefits of those alternatives, including clinical trials.  4. Patients treated with bamlanivimab and casirivimab\imdevimab should continue to self-isolate and use infection control measures (e.g., wear mask, isolate, social distance, avoid sharing personal items, clean and disinfect "high touch" surfaces, and frequent handwashing) according to CDC guidelines.   5. The patient or parent/caregiver has the option to accept or  refuse bamlanivimab or casirivimab\imdevimab .  After reviewing this information with the patient, The patient agreed to proceed with receiving the bamlanimivab infusion and will be provided a copy of the Fact sheet prior to receiving the infusion.   Nicole Mills 04/20/2019 1:08 PM

## 2019-04-21 ENCOUNTER — Encounter (HOSPITAL_COMMUNITY): Payer: Self-pay

## 2019-04-21 ENCOUNTER — Ambulatory Visit (HOSPITAL_COMMUNITY)
Admission: RE | Admit: 2019-04-21 | Discharge: 2019-04-21 | Disposition: A | Discharge status: Home / Self Care | Payer: Medicare Other | Source: Ambulatory Visit | Attending: Pulmonary Disease | Admitting: Pulmonary Disease

## 2019-04-21 DIAGNOSIS — U071 COVID-19: Secondary | ICD-10-CM | POA: Diagnosis present

## 2019-04-21 DIAGNOSIS — G20 Parkinson's disease: Secondary | ICD-10-CM | POA: Insufficient documentation

## 2019-04-21 DIAGNOSIS — Z23 Encounter for immunization: Secondary | ICD-10-CM | POA: Insufficient documentation

## 2019-04-21 MED ORDER — ALBUTEROL SULFATE HFA 108 (90 BASE) MCG/ACT IN AERS: 2.0000 | INHALATION_SPRAY | Freq: Once | RESPIRATORY_TRACT | Status: DC | PRN

## 2019-04-21 MED ORDER — SODIUM CHLORIDE 0.9 % IV SOLN
INTRAVENOUS | Status: DC | PRN
  Administered 2019-04-21: 250 mL via INTRAVENOUS

## 2019-04-21 MED ORDER — DIPHENHYDRAMINE HCL 50 MG/ML IJ SOLN: 50.0000 mg | Freq: Once | INTRAMUSCULAR | Status: DC | PRN

## 2019-04-21 MED ORDER — METHYLPREDNISOLONE SODIUM SUCC 125 MG IJ SOLR: 125.0000 mg | Freq: Once | INTRAMUSCULAR | Status: DC | PRN

## 2019-04-21 MED ORDER — FAMOTIDINE IN NACL 20-0.9 MG/50ML-% IV SOLN: 20.0000 mg | Freq: Once | INTRAVENOUS | Status: DC | PRN

## 2019-04-21 MED ORDER — EPINEPHRINE 0.3 MG/0.3ML IJ SOAJ: 0.3000 mg | Freq: Once | INTRAMUSCULAR | Status: DC | PRN

## 2019-04-21 MED ORDER — SODIUM CHLORIDE 0.9 % IV SOLN
700.0000 mg | Freq: Once | INTRAVENOUS | Status: AC
  Administered 2019-04-21: 700 mg via INTRAVENOUS
  Filled 2019-04-21: qty 20

## 2019-04-21 NOTE — Discharge Instructions (Signed)

## 2019-04-21 NOTE — Progress Notes (Signed)
  Diagnosis: COVID-19  Physician: Dr. Wright  Procedure: [facility] Infusion [facility] Med: bamlanivimab infusion - Provided patient with bamlanimivab fact sheet for patients, parents and caregivers prior to infusion.  Complications: No immediate complications noted.  Discharge: Discharged home   Delesha Pohlman N Robinn Overholt 04/21/2019  

## 2019-05-02 ENCOUNTER — Other Ambulatory Visit (HOSPITAL_COMMUNITY): Payer: Self-pay

## 2019-05-02 DIAGNOSIS — R05 Cough: Secondary | ICD-10-CM

## 2019-05-02 DIAGNOSIS — R059 Cough, unspecified: Secondary | ICD-10-CM

## 2019-05-02 DIAGNOSIS — R1310 Dysphagia, unspecified: Secondary | ICD-10-CM

## 2019-05-04 ENCOUNTER — Other Ambulatory Visit: Payer: Self-pay

## 2019-05-04 ENCOUNTER — Emergency Department (HOSPITAL_COMMUNITY): Payer: Medicare Other

## 2019-05-04 ENCOUNTER — Emergency Department (HOSPITAL_COMMUNITY)
Admission: EM | Admit: 2019-05-04 | Discharge: 2019-05-04 | Disposition: A | Discharge status: Home / Self Care | Payer: Medicare Other | Attending: Emergency Medicine | Admitting: Emergency Medicine

## 2019-05-04 DIAGNOSIS — M79652 Pain in left thigh: Secondary | ICD-10-CM | POA: Diagnosis not present

## 2019-05-04 DIAGNOSIS — R531 Weakness: Secondary | ICD-10-CM | POA: Diagnosis not present

## 2019-05-04 DIAGNOSIS — G20 Parkinson's disease: Secondary | ICD-10-CM | POA: Diagnosis not present

## 2019-05-04 DIAGNOSIS — Z79899 Other long term (current) drug therapy: Secondary | ICD-10-CM | POA: Diagnosis not present

## 2019-05-04 DIAGNOSIS — W01198A Fall on same level from slipping, tripping and stumbling with subsequent striking against other object, initial encounter: Secondary | ICD-10-CM | POA: Insufficient documentation

## 2019-05-04 DIAGNOSIS — R42 Dizziness and giddiness: Secondary | ICD-10-CM | POA: Insufficient documentation

## 2019-05-04 DIAGNOSIS — W19XXXA Unspecified fall, initial encounter: Secondary | ICD-10-CM

## 2019-05-04 LAB — CBC WITH DIFFERENTIAL/PLATELET
Abs Immature Granulocytes: 0.12 10*3/uL — ABNORMAL HIGH (ref 0.00–0.07)
Basophils Absolute: 0.1 10*3/uL (ref 0.0–0.1)
Basophils Relative: 1 %
Eosinophils Absolute: 0.1 10*3/uL (ref 0.0–0.5)
Eosinophils Relative: 1 %
HCT: 38.2 % (ref 36.0–46.0)
Hemoglobin: 12.1 g/dL (ref 12.0–15.0)
Immature Granulocytes: 1 %
Lymphocytes Relative: 12 %
Lymphs Abs: 1.8 10*3/uL (ref 0.7–4.0)
MCH: 30.6 pg (ref 26.0–34.0)
MCHC: 31.7 g/dL (ref 30.0–36.0)
MCV: 96.7 fL (ref 80.0–100.0)
Monocytes Absolute: 1.3 10*3/uL — ABNORMAL HIGH (ref 0.1–1.0)
Monocytes Relative: 8 %
Neutro Abs: 12.2 10*3/uL — ABNORMAL HIGH (ref 1.7–7.7)
Neutrophils Relative %: 77 %
Platelets: 360 10*3/uL (ref 150–400)
RBC: 3.95 MIL/uL (ref 3.87–5.11)
RDW: 14.3 % (ref 11.5–15.5)
WBC: 15.6 10*3/uL — ABNORMAL HIGH (ref 4.0–10.5)
nRBC: 0 % (ref 0.0–0.2)

## 2019-05-04 LAB — COMPREHENSIVE METABOLIC PANEL
ALT: 5 U/L (ref 0–44)
AST: 17 U/L (ref 15–41)
Albumin: 3.5 g/dL (ref 3.5–5.0)
Alkaline Phosphatase: 54 U/L (ref 38–126)
Anion gap: 8 (ref 5–15)
BUN: 17 mg/dL (ref 8–23)
CO2: 29 mmol/L (ref 22–32)
Calcium: 8.9 mg/dL (ref 8.9–10.3)
Chloride: 104 mmol/L (ref 98–111)
Creatinine, Ser: 0.39 mg/dL — ABNORMAL LOW (ref 0.44–1.00)
GFR calc Af Amer: >60 mL/min (ref >60)
GFR calc non Af Amer: >60 mL/min (ref >60)
Glucose, Bld: 90 mg/dL (ref 70–99)
Potassium: 4.1 mmol/L (ref 3.5–5.1)
Sodium: 141 mmol/L (ref 135–145)
Total Bilirubin: 0.7 mg/dL (ref 0.3–1.2)
Total Protein: 6.4 g/dL — ABNORMAL LOW (ref 6.5–8.1)

## 2019-05-04 NOTE — ED Notes (Signed)
Discharge paperwork reviewed with pt and pts daughter.  No questions or concerns voiced by pt or daughter.  Pt assisted to dress, get into wheelchair, wheeled to ED entrance and assisted into daughters car. 2 person assistance needed for transfer.

## 2019-05-04 NOTE — ED Notes (Signed)
Pts daughter is requesting that pt be discharged.  She reports "she is more agitated here, we'll just do better at home."  Pt seen to be with labored, audible breathing. This RN informed pt and pts daughter that we were only waiting on a urine sample in order to complete pts testing.  Daughter declined to wait for obtaining urine sample, refused for pt to be in/out catheterized. EDP Jeraldine Loots made aware.

## 2019-05-04 NOTE — ED Triage Notes (Signed)
Pt BIBA from home.   Per EMS-  Pt had mechanical fall, witnessed by family.  Pt fell back in bathroom and hit head on tile floor.  Pt denies LOC.  Pt reports dizziness immediately prior to falling. Pt denies blood thinners.

## 2019-05-04 NOTE — ED Provider Notes (Signed)
[place] DEPT Provider Note   CSN: [id number] Arrival date & time: 05/04/19  0831     History Chief Complaint  Patient presents with   Dizziness   Fall    Nicole Mills is a 74 y.o. female.  HPI    Presents after a fall.  Patient has baseline dizziness contributing to Parkinson's, as well as chronic issues.  She has been seen and evaluated by her neurologist, and primary care physician.  She states that her symptoms are intractable despite multiple evaluations, interventions.  Today, she had a fall, striking her head, backside.  No loss of consciousness, no new weakness in any extremity.  No change from baseline tremor.  She does have pain in the posterior head, sore, it is unclear if she has any clear modifying factors.  She does have some soreness in her body, though it is unclear if this is new or chronic.  Denies weakness in her lower extremities.  She does have some mild pain in her left thigh, worse with motion.    Past Medical History:  Diagnosis Date   Anemia    Depression    Frequent headaches    High cholesterol    Parkinson's disease ([place])    Tremor    Urine incontinence     There are no problems to display for this patient.   Past Surgical History:  Procedure Laterality Date   TONSILLECTOMY AND ADENOIDECTOMY  1960     OB History   No obstetric history on file.     No family history on file.  Social History   Tobacco Use   Smoking status: Never Smoker   Smokeless tobacco: Never Used  Substance Use Topics   Alcohol use: Not on file   Drug use: Not on file    Home Medications Prior to Admission medications   Medication Sig Start Date End Date Taking? Authorizing Provider  carbidopa-levodopa (SINEMET IR) 25-100 MG tablet Take 1.5 tablets by mouth 6 (six) times daily.     [provider]  Carbidopa-Levodopa ER (SINEMET CR) 25-100 MG tablet controlled release Take 2 tablets by mouth at bedtime.      [provider]  clonazePAM (KLONOPIN) 1 MG tablet Take 0.5-1 mg by mouth daily. 0.5 mg 3-4 x daily and 0.5 at bedtime 02/03/16   [provider]  Cyanocobalamin (B-12) 5000 MCG CAPS Take 1 capsule by mouth daily.    [provider]  entacapone (COMTAN) 200 MG tablet Take 200 mg by mouth 6 (six) times daily.     [provider]  escitalopram (LEXAPRO) 20 MG tablet Take 20 mg by mouth daily. 09/29/16   [provider]  gabapentin (NEURONTIN) 100 MG capsule Take 1 capsule (100 mg total) by mouth 2 (two) times daily. 10/20/16   Tat, Eustace Quail, DO  levothyroxine (SYNTHROID, LEVOTHROID) 100 MCG tablet Take 100 mcg by mouth daily. 09/09/16   [provider]  mirtazapine (REMERON) 15 MG tablet Take 30 mg by mouth at bedtime.     [provider]  Multiple Vitamin (MULTIVITAMIN) capsule Take 1 capsule by mouth daily.    [provider]  Probiotic Product (PROBIOTIC PO) Take by mouth.    [provider]  vitamin C (ASCORBIC ACID) 500 MG tablet Take 500 mg by mouth daily.    [provider]    Allergies    Escitalopram and Penicillins  Review of Systems   Review of Systems  Constitutional:  Per HPI, otherwise negative  HENT:       Per HPI, otherwise negative  Respiratory:       Per HPI, otherwise negative  Cardiovascular:       Per HPI, otherwise negative  Gastrointestinal: Negative for vomiting.  Endocrine:       Negative aside from HPI  Genitourinary:       Neg aside from HPI   Musculoskeletal:       Per HPI, otherwise negative  Skin: Negative.   Neurological: Positive for dizziness, tremors and weakness. Negative for syncope.    Physical Exam Updated Vital Signs BP (!) 100/56 (BP Location: Right Arm)    Pulse 72    Temp 98.4 F (36.9 C) (Oral)    Resp 20    SpO2 96%   Physical Exam Vitals and nursing note reviewed.  Constitutional:      General: She is not in acute distress.     Appearance: She is well-developed. She is ill-appearing.  HENT:     Head: Normocephalic and atraumatic.  Eyes:     Conjunctiva/sclera: Conjunctivae normal.  Cardiovascular:     Rate and Rhythm: Normal rate and regular rhythm.  Pulmonary:     Effort: Pulmonary effort is normal. No respiratory distress.     Breath sounds: Normal breath sounds. No stridor.  Abdominal:     General: There is no distension.  Musculoskeletal:     Cervical back: No tenderness. Decreased range of motion.     Comments: Diffuse atrophy, patient moves all extremities spontaneously, pelvis is stable.  Patient has some pain in the left side, worse with motion, but no appreciable deformities.  Skin:    General: Skin is warm and dry.  Neurological:     Mental Status: She is alert and oriented to person, place, and time.     Cranial Nerves: No cranial nerve deficit.     Motor: Weakness, tremor and atrophy present.     ED Results / Procedures / Treatments   Labs (all labs ordered are listed, but only abnormal results are displayed) Labs Reviewed  COMPREHENSIVE METABOLIC PANEL - Abnormal; Notable for the following components:      Result Value   Creatinine, Ser 0.39 (*)    Total Protein 6.4 (*)    All other components within normal limits  CBC WITH DIFFERENTIAL/PLATELET - Abnormal; Notable for the following components:   WBC 15.6 (*)    Neutro Abs 12.2 (*)    Monocytes Absolute 1.3 (*)    Abs Immature Granulocytes 0.12 (*)    All other components within normal limits  URINALYSIS, ROUTINE W REFLEX MICROSCOPIC    EKG None  Radiology DG Pelvis 1-2 Views  Result Date: 05/04/2019 CLINICAL DATA:  Fall EXAM: PELVIS - 1-2 VIEW COMPARISON:  Left femur today FINDINGS: There is deformity of the left femoral head with flattening and sclerosis. Associated advanced degenerative changes. Findings concerning for ATN. No acute bony abnormality. Specifically, no fracture, subluxation, or dislocation. 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 No acute bony abnormality. Electronically Signed   By: Charlett Nose M.D.   On: 05/04/2019 09:55   CT Head Wo Contrast  Result Date: 05/04/2019 CLINICAL DATA:  Head trauma, minor. Poly trauma, critical, head/cervical spine injury suspected. Additional history provided: Mechanical fall EXAM: CT HEAD WITHOUT CONTRAST CT CERVICAL SPINE WITHOUT CONTRAST TECHNIQUE: Multidetector CT imaging of the head and cervical spine was performed following the standard protocol  without intravenous contrast. Multiplanar CT image reconstructions of the cervical spine were also generated. COMPARISON:  Head CT 07/11/2017 FINDINGS: CT HEAD FINDINGS Brain: There is no evidence of acute intracranial hemorrhage, intracranial mass, midline shift or extra-axial fluid collection.No demarcated cortical infarction. Minimal ill-defined hypoattenuation within the cerebral white matter is nonspecific, but consistent with chronic small vessel ischemic disease. Cerebral volume is normal for age. Vascular: No hyperdense vessel.  Atherosclerotic calcifications. Skull: Normal. Negative for fracture or focal lesion. Sinuses/Orbits: Visualized orbits demonstrate no acute abnormality. Mild ethmoid and maxillary sinus mucosal thickening. No significant mastoid effusion. CT CERVICAL SPINE FINDINGS Alignment: There is significant leftward rotation of C1 relative to C2 which may be related to patient head positioning at the time of examination. Trace C3-C4 retrolisthesis. Grade 1 anterolisthesis at C5-C6 and C6-C7. Skull base and vertebrae: The basion-dental and atlanto-dental intervals are maintained.No evidence of acute fracture to the cervical spine. Soft tissues and spinal canal: No prevertebral fluid or swelling. No visible canal hematoma. Disc levels: Cervical spondylosis without high-grade bony spinal canal stenosis. Of note there is severe C7-T1 disc height  loss with prominent degenerative endplate irregularity and degenerative endplate sclerosis at this level. Degenerative fusion of the posterior elements at the C3-C5 levels. Upper chest: No consolidation within the imaged lung apices. No visible pneumothorax IMPRESSION: CT head: 1. No evidence of acute intracranial abnormality. 2. Minimal chronic small vessel ischemic disease. 3. Mild paranasal sinus mucosal thickening. CT cervical spine: 1. No evidence of acute fracture to the cervical spine. 2. Significant leftward rotation of C1 relative to C2, which may be related to patient head positioning at the time of examination. Clinical correlation is recommended. 3. Cervical spondylosis as described with multilevel spondylolisthesis. Electronically Signed   By: Jackey Loge DO   On: 05/04/2019 10:19   CT Cervical Spine Wo Contrast  Result Date: 05/04/2019 CLINICAL DATA:  Head trauma, minor. Poly trauma, critical, head/cervical spine injury suspected. Additional history provided: Mechanical fall EXAM: CT HEAD WITHOUT CONTRAST CT CERVICAL SPINE WITHOUT CONTRAST TECHNIQUE: Multidetector CT imaging of the head and cervical spine was performed following the standard protocol without intravenous contrast. Multiplanar CT image reconstructions of the cervical spine were also generated. COMPARISON:  Head CT 07/11/2017 FINDINGS: CT HEAD FINDINGS Brain: There is no evidence of acute intracranial hemorrhage, intracranial mass, midline shift or extra-axial fluid collection.No demarcated cortical infarction. Minimal ill-defined hypoattenuation within the cerebral white matter is nonspecific, but consistent with chronic small vessel ischemic disease. Cerebral volume is normal for age. Vascular: No hyperdense vessel.  Atherosclerotic calcifications. Skull: Normal. Negative for fracture or focal lesion. Sinuses/Orbits: Visualized orbits demonstrate no acute abnormality. Mild ethmoid and maxillary sinus mucosal thickening. No  significant mastoid effusion. CT CERVICAL SPINE FINDINGS Alignment: There is significant leftward rotation of C1 relative to C2 which may be related to patient head positioning at the time of examination. Trace C3-C4 retrolisthesis. Grade 1 anterolisthesis at C5-C6 and C6-C7. Skull base and vertebrae: The basion-dental and atlanto-dental intervals are maintained.No evidence of acute fracture to the cervical spine. Soft tissues and spinal canal: No prevertebral fluid or swelling. No visible canal hematoma. Disc levels: Cervical spondylosis without high-grade bony spinal canal stenosis. Of note there is severe C7-T1 disc height loss with prominent degenerative endplate irregularity and degenerative endplate sclerosis at this level. Degenerative fusion of the posterior elements at the C3-C5 levels. Upper chest: No consolidation within the imaged lung apices. No visible pneumothorax IMPRESSION: CT head: 1. No evidence of acute intracranial abnormality. 2.  Minimal chronic small vessel ischemic disease. 3. Mild paranasal sinus mucosal thickening. CT cervical spine: 1. No evidence of acute fracture to the cervical spine. 2. Significant leftward rotation of C1 relative to C2, which may be related to patient head positioning at the time of examination. Clinical correlation is recommended. 3. Cervical spondylosis as described with multilevel spondylolisthesis. Electronically Signed   By: Jackey Loge DO   On: 05/04/2019 10:19   DG FEMUR MIN 2 VIEWS LEFT  Result Date: 05/04/2019 CLINICAL DATA:  Fall. EXAM: LEFT FEMUR 2 VIEWS COMPARISON:  Pelvis performed today FINDINGS: There is flattening and sclerosis in the left femoral head with associated advanced degenerative changes. Appearance is suggestive of AVN. No acute fracture, subluxation or dislocation. Soft tissues are intact. Vascular calcifications noted. IMPRESSION: Deformity of the left femoral head concerning for changes of AVN. Associated advanced degenerative  changes. No acute bony abnormality. Electronically Signed   By: Charlett Nose M.D.   On: 05/04/2019 09:55    Procedures Procedures (including critical care time)  Medications Ordered in ED Medications - No data to display  ED Course  I have reviewed the triage vital signs and the nursing notes.  Pertinent labs & imaging results that were available during my care of the patient were reviewed by me and considered in my medical decision making (see chart for details).  Update: Patient now accompanied by her daughter. Discussed the patient's condition.  It is clear the patient has continuous health care at home, has been debilitated with her Parkinson's, and typically requires the assistance of a health aide to follow behind her immediately to ensure no falls.  It seems as of today there was a slight pause and that arrangement and the patient fell.  Patient is now noted to be interacting in baseline fashion.  We reviewed CT findings, x-ray findings.  Daughter confirms the patient has not a surgical candidate for hip replacement.  Update: Daughter request discharge, and though the urinalysis is not yet back, absent fever, distress, with consideration that the patient is likely in her baseline medical condition, this is reasonable.  We discussed return precautions, follow-up instructions.  This elderly female with multiple medical issues, most notably Parkinson's, baseline gait instability presents after mechanical fall.  Here she is awake, alert, but weak in appearance.  However, patient is not found to have new focal neurologic deficiencies, has reassuring CT, x-ray findings beyond abnormality identified on x-ray which family is aware of.  Patient discharged prior to urinalysis, and completion of exam, though initial studies were reassuring. Final Clinical Impression(s) / ED Diagnoses Final diagnoses:  Warnell Bureau, MD 05/04/19 1320

## 2019-05-04 NOTE — Discharge Instructions (Addendum)
As discussed, your evaluation today has been largely reassuring.  But, it is important that you monitor your condition carefully, and do not hesitate to return to the ED if you develop new, or concerning changes in your condition. ? ?Otherwise, please follow-up with your physician for appropriate ongoing care. ? ?

## 2019-05-04 NOTE — ED Notes (Signed)
PATIENT GIVEN ICE WATER 

## 2019-05-10 ENCOUNTER — Ambulatory Visit (HOSPITAL_COMMUNITY)
Admission: RE | Admit: 2019-05-10 | Discharge: 2019-05-10 | Disposition: A | Discharge status: Home / Self Care | Payer: Medicare Other | Source: Ambulatory Visit | Attending: Nurse Practitioner | Admitting: Nurse Practitioner

## 2019-05-10 ENCOUNTER — Other Ambulatory Visit: Payer: Self-pay

## 2019-05-10 DIAGNOSIS — R1310 Dysphagia, unspecified: Secondary | ICD-10-CM | POA: Diagnosis present

## 2019-05-10 DIAGNOSIS — R05 Cough: Secondary | ICD-10-CM | POA: Diagnosis present

## 2019-05-10 DIAGNOSIS — R059 Cough, unspecified: Secondary | ICD-10-CM

## 2019-05-29 ENCOUNTER — Other Ambulatory Visit: Payer: Self-pay

## 2019-05-29 ENCOUNTER — Emergency Department (HOSPITAL_COMMUNITY): Payer: Medicare Other

## 2019-05-29 ENCOUNTER — Emergency Department (HOSPITAL_COMMUNITY)
Admission: EM | Admit: 2019-05-29 | Discharge: 2019-05-29 | Disposition: A | Discharge status: Home / Self Care | Payer: Medicare Other | Attending: Emergency Medicine | Admitting: Emergency Medicine

## 2019-05-29 ENCOUNTER — Encounter (HOSPITAL_COMMUNITY): Payer: Self-pay | Admitting: Emergency Medicine

## 2019-05-29 DIAGNOSIS — Z79899 Other long term (current) drug therapy: Secondary | ICD-10-CM | POA: Diagnosis not present

## 2019-05-29 DIAGNOSIS — R531 Weakness: Secondary | ICD-10-CM | POA: Diagnosis not present

## 2019-05-29 DIAGNOSIS — G20 Parkinson's disease: Secondary | ICD-10-CM | POA: Insufficient documentation

## 2019-05-29 DIAGNOSIS — R0602 Shortness of breath: Secondary | ICD-10-CM

## 2019-05-29 LAB — COMPREHENSIVE METABOLIC PANEL
ALT: <5 U/L (ref 0–44)
AST: 17 U/L (ref 15–41)
Albumin: 3.7 g/dL (ref 3.5–5.0)
Alkaline Phosphatase: 63 U/L (ref 38–126)
Anion gap: 9 (ref 5–15)
BUN: 22 mg/dL (ref 8–23)
CO2: 26 mmol/L (ref 22–32)
Calcium: 9 mg/dL (ref 8.9–10.3)
Chloride: 109 mmol/L (ref 98–111)
Creatinine, Ser: 0.41 mg/dL — ABNORMAL LOW (ref 0.44–1.00)
GFR calc Af Amer: >60 mL/min (ref >60)
GFR calc non Af Amer: >60 mL/min (ref >60)
Glucose, Bld: 110 mg/dL — ABNORMAL HIGH (ref 70–99)
Potassium: 3.7 mmol/L (ref 3.5–5.1)
Sodium: 144 mmol/L (ref 135–145)
Total Bilirubin: 0.7 mg/dL (ref 0.3–1.2)
Total Protein: 6.5 g/dL (ref 6.5–8.1)

## 2019-05-29 LAB — URINALYSIS, ROUTINE W REFLEX MICROSCOPIC
Bacteria, UA: NONE SEEN
Bilirubin Urine: NEGATIVE
Glucose, UA: NEGATIVE mg/dL
Hgb urine dipstick: NEGATIVE
Ketones, ur: 5 mg/dL — AB
Nitrite: NEGATIVE
Protein, ur: NEGATIVE mg/dL
Specific Gravity, Urine: >1.046 — ABNORMAL HIGH (ref 1.005–1.030)
pH: 6 (ref 5.0–8.0)

## 2019-05-29 LAB — LACTIC ACID, PLASMA: Lactic Acid, Venous: 1.1 mmol/L (ref 0.5–1.9)

## 2019-05-29 LAB — CBC WITH DIFFERENTIAL/PLATELET
Abs Immature Granulocytes: 0.06 10*3/uL (ref 0.00–0.07)
Basophils Absolute: 0.1 10*3/uL (ref 0.0–0.1)
Basophils Relative: 0 %
Eosinophils Absolute: 0.2 10*3/uL (ref 0.0–0.5)
Eosinophils Relative: 1 %
HCT: 39.6 % (ref 36.0–46.0)
Hemoglobin: 12.4 g/dL (ref 12.0–15.0)
Immature Granulocytes: 0 %
Lymphocytes Relative: 16 %
Lymphs Abs: 3.1 10*3/uL (ref 0.7–4.0)
MCH: 31.1 pg (ref 26.0–34.0)
MCHC: 31.3 g/dL (ref 30.0–36.0)
MCV: 99.2 fL (ref 80.0–100.0)
Monocytes Absolute: 1.6 10*3/uL — ABNORMAL HIGH (ref 0.1–1.0)
Monocytes Relative: 8 %
Neutro Abs: 15 10*3/uL — ABNORMAL HIGH (ref 1.7–7.7)
Neutrophils Relative %: 75 %
Platelets: 336 10*3/uL (ref 150–400)
RBC: 3.99 MIL/uL (ref 3.87–5.11)
RDW: 14.6 % (ref 11.5–15.5)
WBC: 20.1 10*3/uL — ABNORMAL HIGH (ref 4.0–10.5)
nRBC: 0 % (ref 0.0–0.2)

## 2019-05-29 LAB — BRAIN NATRIURETIC PEPTIDE: B Natriuretic Peptide: 41.8 pg/mL (ref 0.0–100.0)

## 2019-05-29 MED ORDER — SODIUM CHLORIDE 0.9 % IV SOLN: INTRAVENOUS | Status: DC

## 2019-05-29 MED ORDER — SODIUM CHLORIDE (PF) 0.9 % IJ SOLN
INTRAMUSCULAR | Status: AC
  Filled 2019-05-29: qty 50

## 2019-05-29 MED ORDER — IOHEXOL 350 MG/ML SOLN
100.0000 mL | Freq: Once | INTRAVENOUS | Status: AC | PRN
  Administered 2019-05-29: 10:00:00 75 mL via INTRAVENOUS

## 2019-05-29 NOTE — ED Notes (Signed)
This RN attempted to suction patient. Patient still making snoring sounds at this time. Per Freida Busman, MD, place order for deep laryngeal suctioning. Respiratory notified.

## 2019-05-29 NOTE — Discharge Instructions (Addendum)
Return if the breathing gets worse

## 2019-05-29 NOTE — ED Provider Notes (Addendum)
[facility]-EMERGENCY DEPT Provider Note   CSN: [id number] Arrival date & time: 05/29/19  6599     History Chief Complaint  Patient presents with  . Shortness of Breath    Nicole Mills is a 74 y.o. female.  74 year old female who presents with shortness of breath.  Has a history of Parkinson's and has not had her medications this morning.  According to the daughter she is usually a little more slow in the morning before she has this.  Report temp of 100.8. did have some increased sundowning.  Patient denies any chest pain or chest pressure.  No recent vomiting or aspiration has been noted.  Family endorses increased congestion.  Patient has a history of having Covid.  Patient has been coughing up more mucus recently.  Patient did have an episode of desaturation for short time when she was here but spontaneously resolved.        Past Medical History:  Diagnosis Date  . Anemia   . Depression   . Frequent headaches   . High cholesterol   . Parkinson's disease (HCC)   . Tremor   . Urine incontinence     There are no problems to display for this patient.   Past Surgical History:  Procedure Laterality Date  . TONSILLECTOMY AND ADENOIDECTOMY  1960     OB History   No obstetric history on file.     History reviewed. No pertinent family history.  Social History   Tobacco Use  . Smoking status: Never Smoker  . Smokeless tobacco: Never Used  Substance Use Topics  . Alcohol use: Not on file  . Drug use: Not on file    Home Medications Prior to Admission medications   Medication Sig Start Date End Date Taking? Authorizing Provider  carbidopa-levodopa (SINEMET IR) 25-100 MG tablet Take 1.5 tablets by mouth 6 (six) times daily.     [provider]  Carbidopa-Levodopa ER (SINEMET CR) 25-100 MG tablet controlled release Take 2 tablets by mouth at bedtime.     [provider]  clonazePAM (KLONOPIN) 1 MG tablet Take 0.5-1 mg by mouth  daily. 0.5 mg 3-4 x daily and 0.5 at bedtime 02/03/16   [provider]  Cyanocobalamin (B-12) 5000 MCG CAPS Take 1 capsule by mouth daily.    [provider]  entacapone (COMTAN) 200 MG tablet Take 200 mg by mouth 6 (six) times daily.     [provider]  escitalopram (LEXAPRO) 20 MG tablet Take 20 mg by mouth daily. 09/29/16   [provider]  gabapentin (NEURONTIN) 100 MG capsule Take 1 capsule (100 mg total) by mouth 2 (two) times daily. 10/20/16   Tat, Octaviano Batty, DO  levothyroxine (SYNTHROID, LEVOTHROID) 100 MCG tablet Take 100 mcg by mouth daily. 09/09/16   [provider]  mirtazapine (REMERON) 15 MG tablet Take 30 mg by mouth at bedtime.     [provider]  Multiple Vitamin (MULTIVITAMIN) capsule Take 1 capsule by mouth daily.    [provider]  Probiotic Product (PROBIOTIC PO) Take by mouth.    [provider]  vitamin C (ASCORBIC ACID) 500 MG tablet Take 500 mg by mouth daily.    [provider]    Allergies    Escitalopram and Penicillins  Review of Systems   Review of Systems  All other systems reviewed and are negative.   Physical Exam Updated Vital Signs BP (!) 141/81 (BP Location: Left Arm)   Pulse  90   Temp 98.4 F (36.9 C) (Oral)   Resp 14   Ht 1.626 m (5\' 4" )   Wt 44.9 kg   SpO2 100%   BMI 16.99 kg/m   Physical Exam Vitals and nursing note reviewed.  Constitutional:      General: She is not in acute distress.    Appearance: Normal appearance. She is well-developed. She is not toxic-appearing.  HENT:     Head: Normocephalic and atraumatic.  Eyes:     General: Lids are normal.     Conjunctiva/sclera: Conjunctivae normal.     Pupils: Pupils are equal, round, and reactive to light.  Neck:     Thyroid: No thyroid mass.     Trachea: No tracheal deviation.  Cardiovascular:     Rate and Rhythm: Normal rate and regular rhythm.     Heart sounds: Normal heart sounds. No murmur. No  gallop.   Pulmonary:     Effort: Pulmonary effort is normal. No respiratory distress.     Breath sounds: Normal breath sounds. No stridor. No decreased breath sounds, wheezing, rhonchi or rales.  Abdominal:     General: Bowel sounds are normal. There is no distension.     Palpations: Abdomen is soft.     Tenderness: There is no abdominal tenderness. There is no rebound.  Musculoskeletal:        General: No tenderness. Normal range of motion.     Cervical back: Normal range of motion and neck supple.  Skin:    General: Skin is warm and dry.     Findings: No abrasion or rash.  Neurological:     Mental Status: She is oriented to person, place, and time. Mental status is at baseline. She is lethargic.     GCS: GCS eye subscore is 4. GCS verbal subscore is 5. GCS motor subscore is 6.     Cranial Nerves: No cranial nerve deficit.     Motor: Weakness and tremor present.  Psychiatric:        Attention and Perception: Attention normal.        Speech: Speech is delayed.     ED Results / Procedures / Treatments   Labs (all labs ordered are listed, but only abnormal results are displayed) Labs Reviewed  URINE CULTURE  CBC WITH DIFFERENTIAL/PLATELET  COMPREHENSIVE METABOLIC PANEL  URINALYSIS, ROUTINE W REFLEX MICROSCOPIC  BRAIN NATRIURETIC PEPTIDE    EKG None  Radiology DG Chest Port 1 View  Result Date: 05/29/2019 CLINICAL DATA:  Congestive cough and shortness of breath. EXAM: PORTABLE CHEST 1 VIEW COMPARISON:  None. FINDINGS: Monitoring leads overlie the patient. Stable cardiac and media is crash that normal cardiac and mediastinal contours. No consolidative pulmonary opacities. No pleural effusion or pneumothorax. Thoracic spine degenerative changes. IMPRESSION: No acute cardiopulmonary process. Electronically Signed   By: Lovey Newcomer M.D.   On: 05/29/2019 08:07    Procedures Procedures (including critical care time)  Medications Ordered in ED Medications  0.9 %  sodium  chloride infusion (has no administration in time range)    ED Course  I have reviewed the triage vital signs and the nursing notes.  Pertinent labs & imaging results that were available during my care of the patient were reviewed by me and considered in my medical decision making (see chart for details).    MDM Rules/Calculators/A&P                     Patient with trouble breathing at  this time and chest x-ray without acute findings.  Subsequently had chest CT which was negative for PE.  Some anxiety component to this as well as Parkinson's.  Given her home Parkinson medication with improve movement.  Daughter very concerned about patient's trouble swallowing.  Was suctioned by respiratory therapy with mucus obtained.  Patient able to speak.  Patient received her home medication for anxiety and the plan was to wait for to see the effect on her breathing.  Patient's daughter however according to the nurse became upset and wanted the patient to be discharged.  At time of discharge, patient is controlling her secretions.  She does not have any signs of airway compromise.  She wishes to go home Final Clinical Impression(s) / ED Diagnoses Final diagnoses:  None    Rx / DC Orders ED Discharge Orders    None       Lorre Nick, MD 05/29/19 1316    Lorre Nick, MD 05/29/19 1327

## 2019-05-29 NOTE — ED Triage Notes (Signed)
Pt arriving by EMS from home, family states patient has been SOB and congested since this AM. Patient has hx of Parkinson's, unable to tolerate medications this morning. Pt's daughter reports patient coughing up thick mucus.   BP 108/60 P 110  97% RA  T 100.8

## 2019-05-30 LAB — URINE CULTURE

## 2019-06-03 LAB — CULTURE, BLOOD (ROUTINE X 2): Culture: NO GROWTH

## 2019-06-05 LAB — CULTURE, BLOOD (ROUTINE X 2): Culture: NO GROWTH

## 2019-11-21 ENCOUNTER — Ambulatory Visit: Payer: Medicare Other

## 2020-01-01 DEATH — deceased

## 2021-06-17 IMAGING — CT CT HEAD W/O CM
2 of 4 series · 12 of 47 positions shown, 15 images · non-contrast
Comparison: Head CT 07/11/2017

CLINICAL DATA: Head trauma, minor. Poly trauma, critical,
head/cervical spine injury suspected. Additional history provided:
Mechanical fall

EXAM:
CT HEAD WITHOUT CONTRAST
CT CERVICAL SPINE WITHOUT CONTRAST
TECHNIQUE: Multidetector CT imaging of the head and cervical spine was
performed following the standard protocol without intravenous
contrast. Multiplanar CT image reconstructions of the cervical spine
were also generated.

[Series 4: coronal soft tissue · coronal · 0.37mm/px · 3 of 70 slices shown]
[im 24/70  brain]
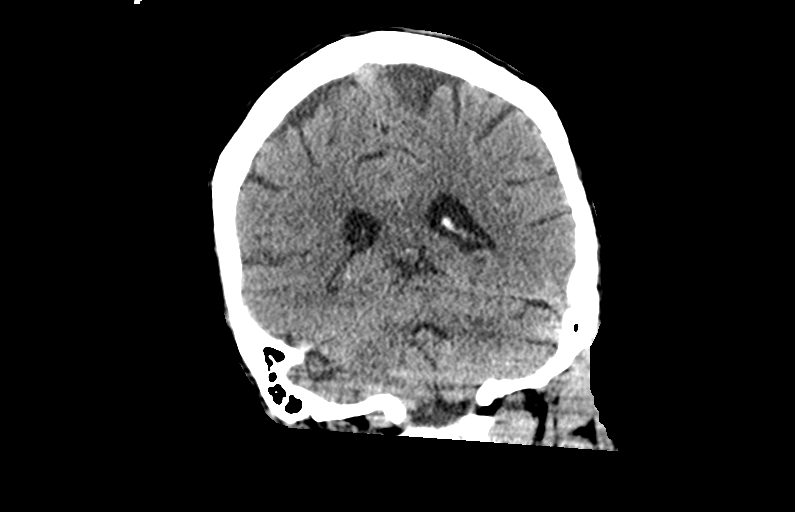
[im 31/70  brain]
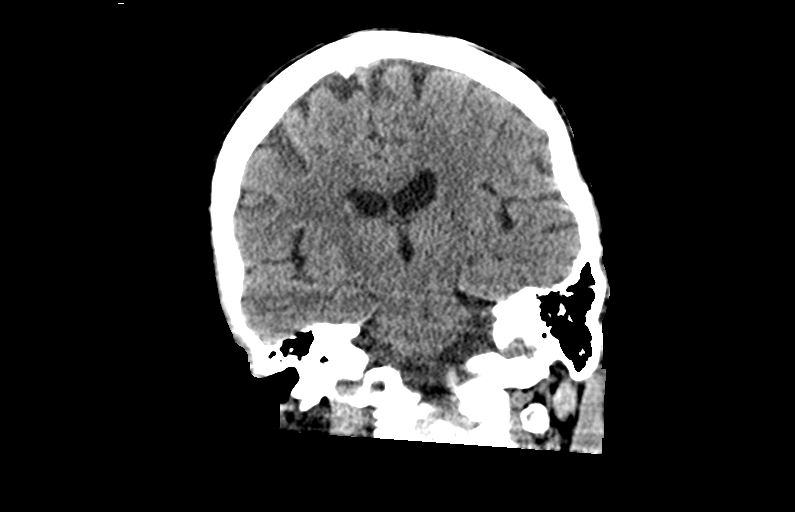
[im 39/70  brain]
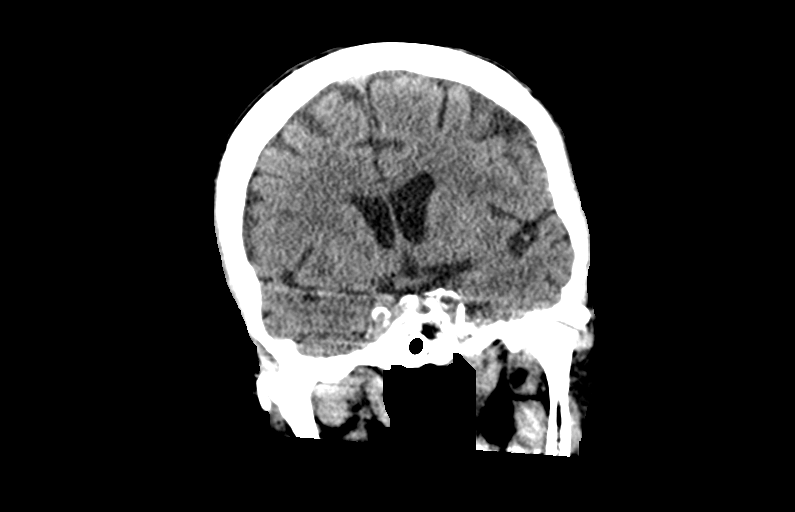

[Series 7: ang.axials · axial · 0.51mm/px · z∈[-143,-17]mm · 9 of 51 slices shown, 12 images]
[im 3/51  brain]
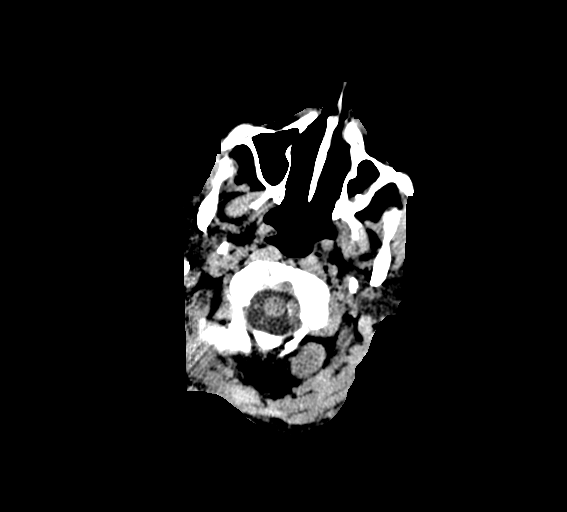
[im 3/51  bone]
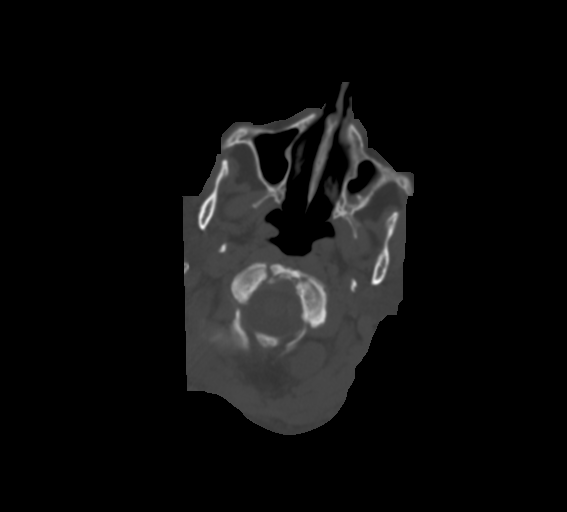
[im 9/51  brain]
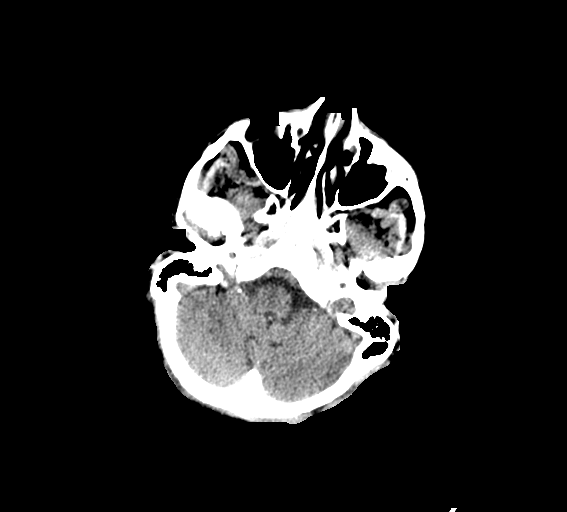
[im 15/51  brain]
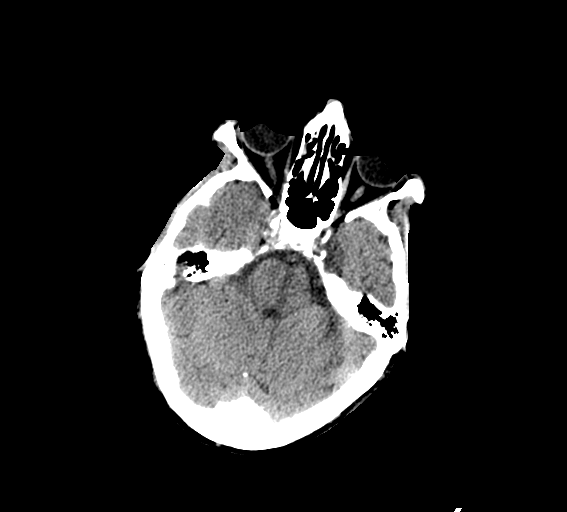
[im 21/51  brain]
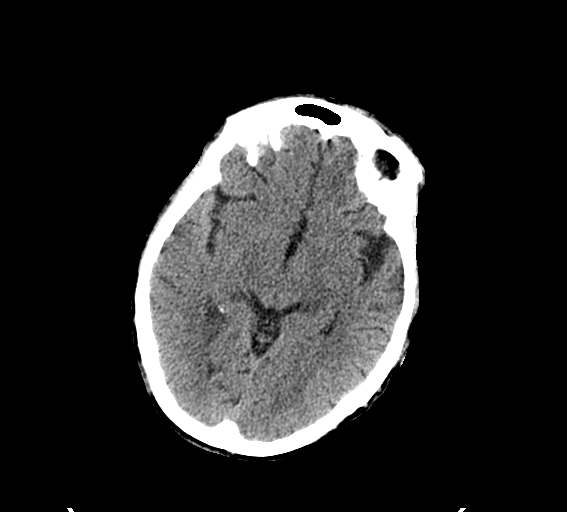
[im 27/51  brain]
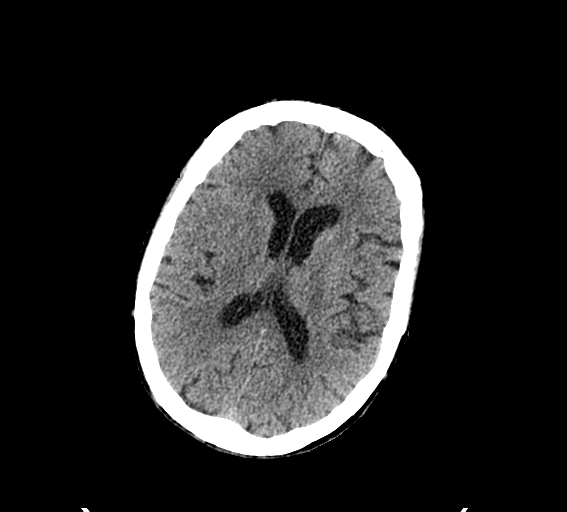
[im 27/51  bone]
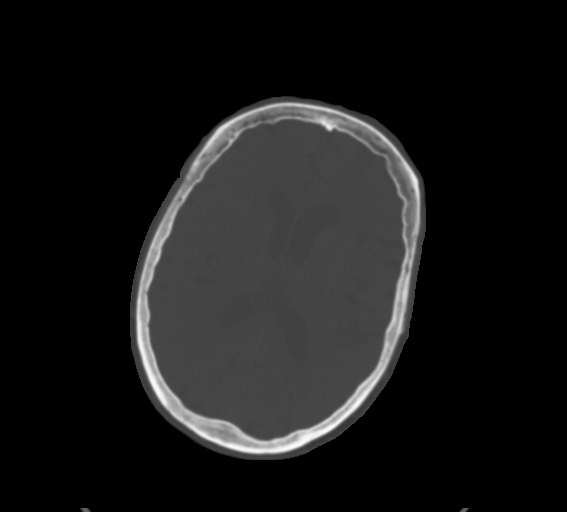
[im 30/51  brain]
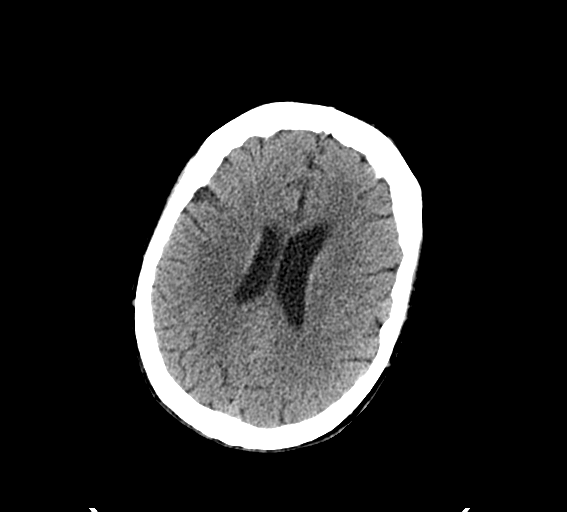
[im 36/51  brain]
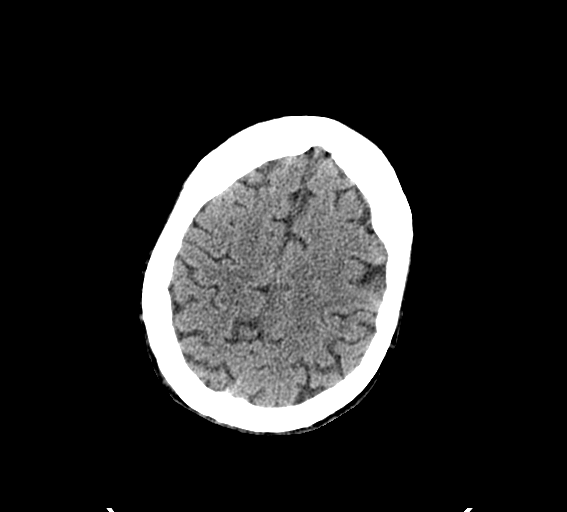
[im 42/51  brain]
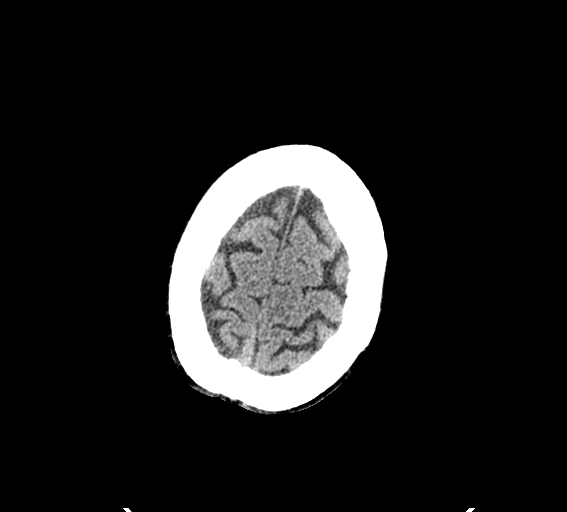
[im 48/51  brain]
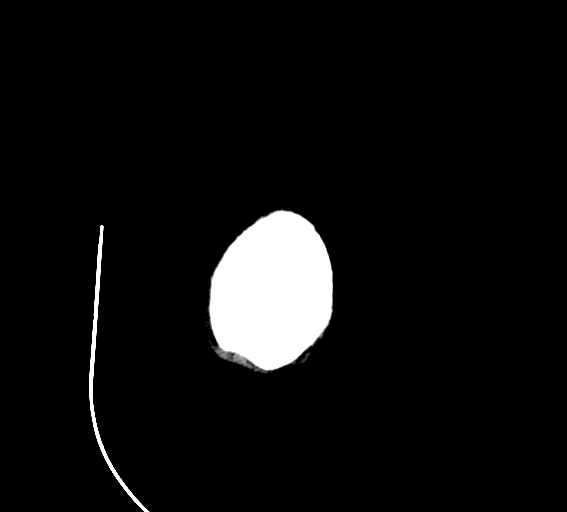
[im 48/51  bone]
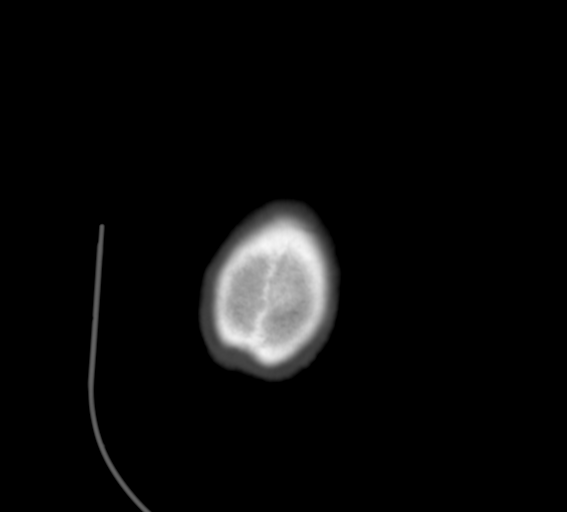

[12 of 47 positions shown; findings below may reference images not displayed]

FINDINGS: CT HEAD FINDINGS

Brain: There is no evidence of acute intracranial hemorrhage,
intracranial mass, midline shift or extra-axial fluid collection.No
demarcated cortical infarction. Minimal ill-defined hypoattenuation
within the cerebral white matter is nonspecific, but consistent with
chronic small vessel ischemic disease. Cerebral volume is normal for
age.

Vascular: No hyperdense vessel.  Atherosclerotic calcifications.

Skull: Normal. Negative for fracture or focal lesion.

Sinuses/Orbits: Visualized orbits demonstrate no acute abnormality.
Mild ethmoid and maxillary sinus mucosal thickening. No significant
mastoid effusion.

CT CERVICAL SPINE FINDINGS

Alignment: There is significant leftward rotation of C1 relative to
C2 which may be related to patient head positioning at the time of
examination. Trace C3-C4 retrolisthesis. Grade 1 anterolisthesis at
C5-C6 and C6-C7.

Skull base and vertebrae: The basion-dental and atlanto-dental
intervals are maintained.No evidence of acute fracture to the
cervical spine.

Soft tissues and spinal canal: No prevertebral fluid or swelling. No
visible canal hematoma.

Disc levels: Cervical spondylosis without high-grade bony spinal
canal stenosis. Of note there is severe C7-T1 disc height loss with
prominent degenerative endplate irregularity and degenerative
endplate sclerosis at this level. Degenerative fusion of the
posterior elements at the C3-C5 levels.

Upper chest: No consolidation within the imaged lung apices. No
visible pneumothorax
IMPRESSION: CT head:

1. No evidence of acute intracranial abnormality.
2. Minimal chronic small vessel ischemic disease.
3. Mild paranasal sinus mucosal thickening.

CT cervical spine:

1. No evidence of acute fracture to the cervical spine.
2. Significant leftward rotation of C1 relative to C2, which may be
related to patient head positioning at the time of examination.
Clinical correlation is recommended.
3. Cervical spondylosis as described with multilevel
spondylolisthesis.

## 2021-07-12 IMAGING — DX DG CHEST 1V PORT
1 series · 1 of 1 positions shown · non-contrast
Comparison: None.

CLINICAL DATA: Congestive cough and shortness of breath.

EXAM:
PORTABLE CHEST 1 VIEW

[chest ap]
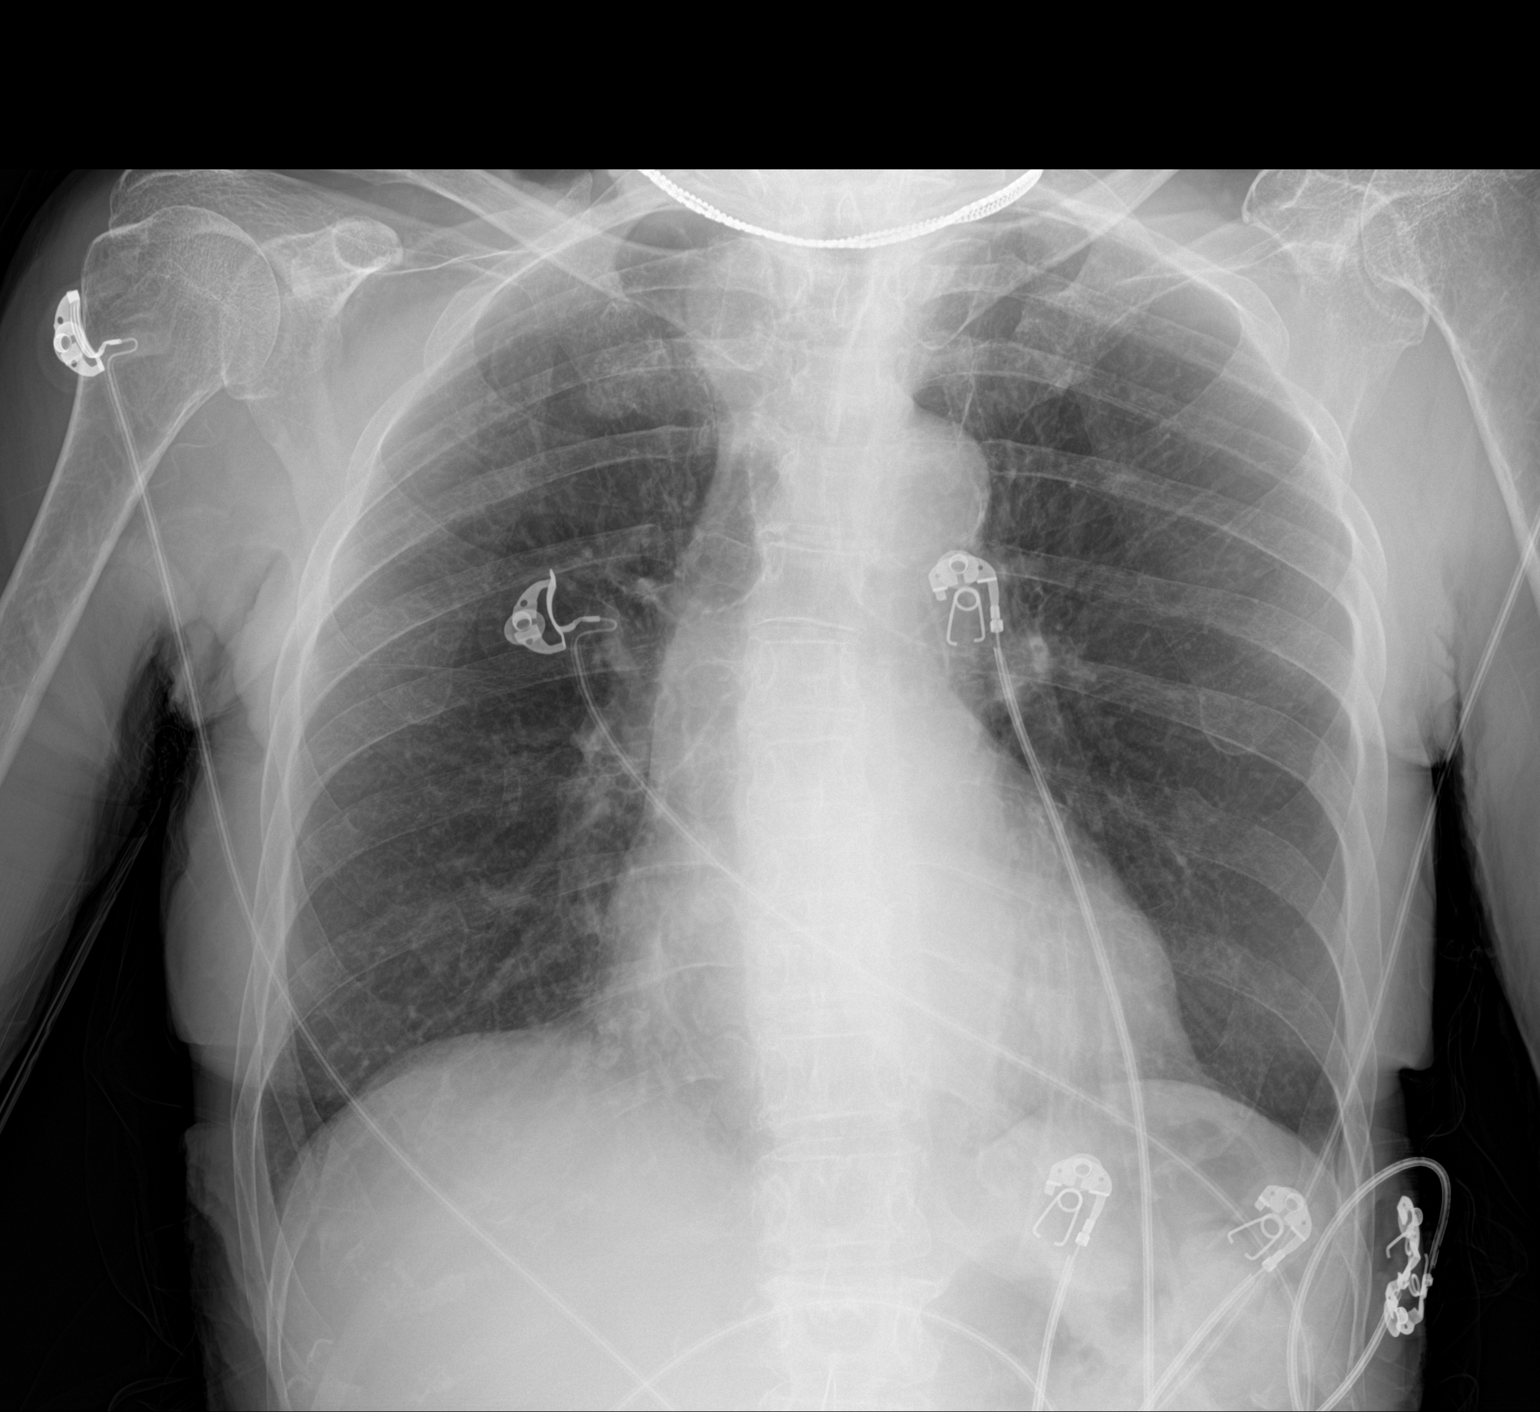

[1 of 1 positions shown; findings below may reference images not displayed]

FINDINGS: Monitoring leads overlie the patient. Stable cardiac and media is
crash that normal cardiac and mediastinal contours. No consolidative
pulmonary opacities. No pleural effusion or pneumothorax. Thoracic
spine degenerative changes.
IMPRESSION: No acute cardiopulmonary process.
